# Patient Record
Sex: Female | Born: 1960 | Race: White | Hispanic: No | Marital: Married | Smoking: Former smoker
Health system: Southern US, Community
[De-identification: ages and names within clinical notes are randomized; demographics above are authoritative.]

## PROBLEM LIST (undated history)

## (undated) DIAGNOSIS — Z780 Asymptomatic menopausal state: Secondary | ICD-10-CM

## (undated) DIAGNOSIS — D649 Anemia, unspecified: Secondary | ICD-10-CM

## (undated) DIAGNOSIS — K219 Gastro-esophageal reflux disease without esophagitis: Secondary | ICD-10-CM

## (undated) DIAGNOSIS — I10 Essential (primary) hypertension: Secondary | ICD-10-CM

## (undated) DIAGNOSIS — G4730 Sleep apnea, unspecified: Secondary | ICD-10-CM

## (undated) DIAGNOSIS — D0500 Lobular carcinoma in situ of unspecified breast: Secondary | ICD-10-CM

## (undated) DIAGNOSIS — C801 Malignant (primary) neoplasm, unspecified: Secondary | ICD-10-CM

## (undated) DIAGNOSIS — F419 Anxiety disorder, unspecified: Secondary | ICD-10-CM

## (undated) HISTORY — DX: Asymptomatic menopausal state: Z78.0

## (undated) HISTORY — DX: Essential (primary) hypertension: I10

## (undated) HISTORY — DX: Anxiety disorder, unspecified: F41.9

## (undated) HISTORY — DX: Sleep apnea, unspecified: G47.30

## (undated) HISTORY — DX: Gastro-esophageal reflux disease without esophagitis: K21.9

## (undated) HISTORY — DX: Lobular carcinoma in situ of unspecified breast: D05.00

---

## 2005-12-06 HISTORY — PX: NOVASURE ABLATION: SHX5394

## 2010-06-10 ENCOUNTER — Ambulatory Visit: Payer: Self-pay | Admitting: Obstetrics and Gynecology

## 2011-01-19 DIAGNOSIS — D0500 Lobular carcinoma in situ of unspecified breast: Secondary | ICD-10-CM

## 2011-01-19 HISTORY — DX: Lobular carcinoma in situ of unspecified breast: D05.00

## 2011-12-07 DIAGNOSIS — Z780 Asymptomatic menopausal state: Secondary | ICD-10-CM

## 2011-12-07 HISTORY — DX: Asymptomatic menopausal state: Z78.0

## 2012-07-04 ENCOUNTER — Ambulatory Visit: Payer: Self-pay | Admitting: Obstetrics and Gynecology

## 2012-09-07 ENCOUNTER — Ambulatory Visit: Payer: Self-pay | Admitting: Anesthesiology

## 2012-09-07 LAB — POTASSIUM: Potassium: 3.9 mmol/L (ref 3.5–5.1)

## 2012-09-08 ENCOUNTER — Ambulatory Visit: Payer: Self-pay | Admitting: Obstetrics and Gynecology

## 2012-12-06 DIAGNOSIS — C801 Malignant (primary) neoplasm, unspecified: Secondary | ICD-10-CM

## 2012-12-06 HISTORY — PX: BREAST LUMPECTOMY: SHX2

## 2012-12-06 HISTORY — DX: Malignant (primary) neoplasm, unspecified: C80.1

## 2013-08-09 ENCOUNTER — Ambulatory Visit: Payer: Self-pay | Admitting: Obstetrics and Gynecology

## 2013-08-15 ENCOUNTER — Ambulatory Visit: Payer: Self-pay | Admitting: Obstetrics and Gynecology

## 2013-08-27 ENCOUNTER — Ambulatory Visit: Payer: Self-pay | Admitting: Obstetrics and Gynecology

## 2013-10-24 ENCOUNTER — Ambulatory Visit: Payer: Self-pay | Admitting: Anesthesiology

## 2013-10-24 LAB — POTASSIUM: Potassium: 3.4 mmol/L — ABNORMAL LOW (ref 3.5–5.1)

## 2013-10-26 ENCOUNTER — Ambulatory Visit: Payer: Self-pay | Admitting: Surgery

## 2013-10-26 HISTORY — PX: BREAST EXCISIONAL BIOPSY: SUR124

## 2013-10-26 HISTORY — PX: BREAST SURGERY: SHX581

## 2013-10-30 LAB — PATHOLOGY REPORT

## 2014-08-23 ENCOUNTER — Ambulatory Visit: Payer: Self-pay | Admitting: Surgery

## 2015-03-28 NOTE — Op Note (Signed)
PATIENT NAME:  Lori May, Lori May MR#:  [id number] DATE OF BIRTH:  09-01-1961  DATE OF PROCEDURE:  10/26/2013  PREOPERATIVE DIAGNOSIS: Right breast lobular carcinoma in situ.   POSTOPERATIVE DIAGNOSIS: Right breast lobular carcinoma in situ.   OPERATION: Right breast partial mastectomy.   SURGEON: Rodena Goldmann.   ANESTHESIA: General.   OPERATIVE PROCEDURE: With the patient in the supine position after induction of appropriate general anesthesia, the patient's right breast was prepped with ChloraPrep and draped with sterile towels. The lesion had been needle localized. An elliptical incision was made around partial portion of the areolar complex, and then down through the subcutaneous tissue with Bovie electrocautery. The area identified by the wire was encompassed by the dissection, grasped with Allis clamp and carefully dissected free. Both wires were identified and removed in the specimen. The specimen was sent for specimen mammography, which did reveal the presence of the microcalcifications and the previous stereotactic marker. The area was copiously irrigated. The subcutaneous space was obliterated with 3-0 Vicryl. The skin closed with a running subcuticular suture of 4-0 Vicryl. Benzoin and Steri-Strips were applied. The patient was returned to the recovery room having tolerated the procedure well. Sponge, instrument, and needle counts were correct x 2 in the operating room.   ____________________________ Micheline Maze, MD rle:aw D: 10/26/2013 11:48:34 ET T: 10/26/2013 11:59:21 ET JOB#: [id number]  cc: Rodena Goldmann III, MD, <Dictator> Shelby Mattocks. Georga Bora, MD Rodena Goldmann MD ELECTRONICALLY SIGNED 10/26/2013 17:50

## 2015-04-22 ENCOUNTER — Other Ambulatory Visit: Payer: Self-pay | Admitting: Obstetrics and Gynecology

## 2015-04-22 DIAGNOSIS — Z1231 Encounter for screening mammogram for malignant neoplasm of breast: Secondary | ICD-10-CM

## 2015-08-25 ENCOUNTER — Other Ambulatory Visit: Payer: Self-pay | Admitting: Obstetrics and Gynecology

## 2015-08-25 DIAGNOSIS — D0590 Unspecified type of carcinoma in situ of unspecified breast: Secondary | ICD-10-CM

## 2015-09-09 ENCOUNTER — Other Ambulatory Visit: Payer: Self-pay | Admitting: Obstetrics and Gynecology

## 2015-09-09 DIAGNOSIS — D0590 Unspecified type of carcinoma in situ of unspecified breast: Secondary | ICD-10-CM

## 2015-09-25 ENCOUNTER — Other Ambulatory Visit: Payer: Self-pay | Admitting: Obstetrics and Gynecology

## 2015-09-25 ENCOUNTER — Ambulatory Visit
Admission: RE | Admit: 2015-09-25 | Discharge: 2015-09-25 | Disposition: A | Discharge status: Home / Self Care | Payer: 59 | Source: Ambulatory Visit | Attending: Obstetrics and Gynecology | Admitting: Obstetrics and Gynecology

## 2015-09-25 DIAGNOSIS — R921 Mammographic calcification found on diagnostic imaging of breast: Secondary | ICD-10-CM | POA: Insufficient documentation

## 2015-09-25 DIAGNOSIS — N6002 Solitary cyst of left breast: Secondary | ICD-10-CM | POA: Diagnosis not present

## 2015-09-25 DIAGNOSIS — D0590 Unspecified type of carcinoma in situ of unspecified breast: Secondary | ICD-10-CM | POA: Diagnosis present

## 2015-09-30 ENCOUNTER — Other Ambulatory Visit: Payer: Self-pay | Admitting: Obstetrics and Gynecology

## 2015-09-30 DIAGNOSIS — R921 Mammographic calcification found on diagnostic imaging of breast: Secondary | ICD-10-CM

## 2015-10-06 ENCOUNTER — Ambulatory Visit
Admission: RE | Admit: 2015-10-06 | Discharge: 2015-10-06 | Disposition: A | Discharge status: Home / Self Care | Payer: 59 | Source: Ambulatory Visit | Attending: Obstetrics and Gynecology | Admitting: Obstetrics and Gynecology

## 2015-10-06 DIAGNOSIS — R921 Mammographic calcification found on diagnostic imaging of breast: Secondary | ICD-10-CM | POA: Diagnosis not present

## 2015-10-06 HISTORY — PX: BREAST BIOPSY: SHX20

## 2015-10-08 ENCOUNTER — Other Ambulatory Visit: Payer: Self-pay | Admitting: Obstetrics and Gynecology

## 2015-10-08 LAB — SURGICAL PATHOLOGY

## 2015-11-07 ENCOUNTER — Ambulatory Visit (INDEPENDENT_AMBULATORY_CARE_PROVIDER_SITE_OTHER): Payer: 59 | Admitting: Surgery

## 2015-11-07 ENCOUNTER — Encounter: Payer: Self-pay | Admitting: Surgery

## 2015-11-07 VITALS — BP 111/76 | HR 73 | Temp 97.8°F | Ht 67.0 in | Wt 139.0 lb

## 2015-11-07 DIAGNOSIS — N6092 Unspecified benign mammary dysplasia of left breast: Secondary | ICD-10-CM | POA: Insufficient documentation

## 2015-11-07 DIAGNOSIS — N62 Hypertrophy of breast: Secondary | ICD-10-CM | POA: Diagnosis not present

## 2015-11-07 NOTE — Patient Instructions (Signed)
Angie will be contacting you with your pre-operation appointment. If you have any questions or concerns, please give [place] a call.

## 2015-11-07 NOTE — Progress Notes (Signed)
Subjective:     Patient ID: Lori May, female   DOB: 1961/04/19, 54 y.o.   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  Patient underwent routine mamogram and found area on left breast with ADH as well.  She had 2 children starting at age 53, did not breast feed, was on birth control for 15 years and has not used any HRT.  She went through menopause about 3 years ago.  She has no family history of breast or other types of cancer.  She has not noticed any fever, chills, malaise, night sweats, weight loss, skin changes or nipple discharge.  Past Medical History  Diagnosis Date  . Breast cancer ([place]) 08/2013    right breast LCIS. No rad or chemo tx  . Hypertension   . Postmenopausal 2013   Past Surgical History  Procedure Laterality Date  . Breast surgery Right 10/26/2013    Partial Mastectomy   Family History  Problem Relation Age of Onset  . Leukemia Mother 47  . Heart disease Father   . Hypertension Sister   . Hypertension Brother    Social History   Social History  . Marital Status: Married    Spouse Name: N/A  . Number of Children: N/A  . Years of Education: N/A   Social History Main Topics  . Smoking status: Current Every Day Smoker -- 0.50 packs/day    Types: Cigarettes  . Smokeless tobacco: None     Comment: ocassional smoker  . Alcohol Use: 0.0 oz/week    0 Standard drinks or equivalent per week     Comment: wine   . Drug Use: No  . Sexual Activity: Not Asked   Other Topics Concern  . None   Social History Narrative    Current outpatient prescriptions:  .  acetaminophen (TYLENOL) 500 MG tablet, Take 500 mg by mouth as needed., Disp: , Rfl:  .  hydrochlorothiazide (HYDRODIURIL) 25 MG tablet, Take 1 tablet by mouth 1 day or 1 dose., Disp: , Rfl:  .  lisinopril (PRINIVIL,ZESTRIL) 10 MG tablet, Take 10 mg by mouth every morning., Disp: , Rfl: 0 .  traZODone  (DESYREL) 100 MG tablet, Take 1 tablet by mouth 1 day or 1 dose., Disp: , Rfl: 0 .  zolpidem (AMBIEN) 10 MG tablet, Take 1 tablet by mouth 1 day or 1 dose., Disp: , Rfl: 0 No Known Allergies    Review of Systems  Constitutional: Negative for fever, chills, activity change, appetite change, fatigue and unexpected weight change.  HENT: Negative for congestion and sore throat.   Respiratory: Negative for cough, shortness of breath and wheezing.   Cardiovascular: Negative for chest pain, palpitations and leg swelling.  Gastrointestinal: Negative for nausea, vomiting, abdominal pain and abdominal distention.  Genitourinary: Negative for dysuria and hematuria.  Musculoskeletal: Negative for back pain and joint swelling.  Skin: Negative for color change, pallor, rash and wound.  Neurological: Negative for dizziness and weakness.  Hematological: Negative for adenopathy. Does not bruise/bleed easily.  Psychiatric/Behavioral: Negative for agitation. The patient is not nervous/anxious.   All other systems reviewed and are negative.  Filed Vitals:   11/07/15 1031  BP: 111/76  Pulse: 73  Temp: 97.8 F (36.6 C)       Objective:   Physical Exam  Constitutional: She is oriented to person, place, and time. She appears well-developed and well-nourished. No  distress.  HENT:  Head: Atraumatic.  Right Ear: External ear normal.  Left Ear: External ear normal.  Nose: Nose normal.  Mouth/Throat: Oropharynx is clear and moist. No oropharyngeal exudate.  Eyes: Conjunctivae and EOM are normal. Pupils are equal, round, and reactive to light. No scleral icterus.  Neck: Normal range of motion. No tracheal deviation present.  Cardiovascular: Regular rhythm, normal heart sounds and intact distal pulses.  Exam reveals no gallop and no friction rub.   No murmur heard. Pulmonary/Chest: Effort normal and breath sounds normal. No respiratory distress. She has no wheezes. She has no rales. She exhibits no  tenderness.  Abdominal: Soft. Bowel sounds are normal. She exhibits no distension. There is no tenderness. There is no rebound and no guarding.  Musculoskeletal: Normal range of motion. She exhibits no edema or tenderness.  Neurological: She is alert and oriented to person, place, and time. No cranial nerve deficit.  Skin: Skin is warm and dry. No rash noted. No erythema. No pallor.  Psychiatric: She has a normal mood and affect. Her behavior is normal. Judgment and thought content normal.  Vitals reviewed. Right breast: well healed incision site around N/A complex, no other skin changes, no masses, no nipple retraction, no supraclavicular or axillary lymphadenopathy  Left breast:  No skin changes, no masses palpable, no nipple retraction, no supraclavicular or axillary lymphadenopathy.   IMAGING  IMPRESSION: 1. New loosely grouped pleomorphic calcifications within the inner LEFT breast, at middle to posterior depth, for which stereotactic-guided biopsy is recommended. 2. Probably benign complicated cysts within the left breast at the 2 o'clock axis, 3-5 cm from the nipple, largest measuring 9 mm greatest dimension. Regardless of the results of the upcoming stereotactic biopsy, a six-month follow-up left breast diagnostic mammogram and ultrasound will be needed to ensure stability.  Labs:  ATYPICAL DUCTAL HYPERPLASIA WITH MICROCALCIFICATIONS.  - FIBROCYSTIC CHANGE WITH MICROCALCIFICATIONS.     Assessment:     54 yr old with ADH in left breast with hx of LCIS prior    Plan:     I discussed the available options with the patient and husband. With a history of LCIS her risk for breast cancer or DCIS in either breast is increased from average.  Now with ADH in the opposite breast her relative risk is now 4, with about a 25% lifetime risk of cancer.  I discussed with patient that there is a 30% chance of a sampling error and actually having DCIS or invasive cancer that wasn't picked up  on the biopsy specimen.  She was interested in avoiding any operations if at all possible and while there are some studies with low risk patient with ADH alone the current recommendation is excisional biopsy.   I also discussed that given the small size of the area would recommend a needle localization lumpectomy with radiation to follow. I also discussed that we would not need to get lymph nodes with this but if the pathology came back as cancer on the final may need second procedure to sample these.  I also discussed with patient that given LCIS history and now ADH in contralateral breast would put her risk above the 1% margin and would recommend her getting Tamoxifen for 5 years postoperatively to help reduce risk of a breast cancer in the future.  Patient is in agreement with the pain.    I discussed risks of bleeding, infection, damage to surrounding tissues, having positive margins, needing further resection; as well as anesthesia risks of  MI, stroke, prolonged ventilation, pulmonary embolism, thrombosis and even death. Patient was given the opportunity to ask questions and have them answered. They would like to proceed with Left breast needle localized lumpectomy.

## 2015-12-11 NOTE — Addendum Note (Signed)
Encounter addended by: Wayne Both on: 12/11/2015  1:42 PM<BR>     Documentation filed: Charges VN

## 2015-12-15 ENCOUNTER — Telehealth: Payer: Self-pay | Admitting: Surgery

## 2015-12-15 NOTE — Telephone Encounter (Signed)
I have called patient to advise patient of pre op date/time and sx date, no answer. I have left a message on voicemail for patient to call back for information below.  Sx: 01/07/16 with Dr Carole Binning needle localization lumpectomy.  Pre op: 12/29/15 between 9-1:00pm--Phone.  Patient to arrive at 8:15am at [facility] the day of surgery/ Patient does not need to call the day before to find out arrival time.

## 2015-12-17 NOTE — Telephone Encounter (Signed)
Patient has called back and surgery information was given. Patient understands all directions.

## 2015-12-29 ENCOUNTER — Encounter: Payer: Self-pay | Admitting: *Deleted

## 2015-12-29 ENCOUNTER — Other Ambulatory Visit: Payer: 59

## 2015-12-29 NOTE — Patient Instructions (Signed)
  Your procedure is scheduled on: 01-07-16 [facility]) Report to [place] (8:15)   Remember: Instructions that are not followed completely may result in serious medical risk, up to and including death, or upon the discretion of your surgeon and anesthesiologist your surgery may need to be rescheduled.    _X___ 1. Do not eat food or drink liquids after midnight. No gum chewing or hard candies.     _X___ 2. No Alcohol for 24 hours before or after surgery.   ____ 3. Bring all medications with you on the day of surgery if instructed.    _X___ 4. Notify your doctor if there is any change in your medical condition     (cold, fever, infections).     Do not wear jewelry, make-up, hairpins, clips or nail polish.  Do not wear lotions, powders, or perfumes. You may wear deodorant.  Do not shave 48 hours prior to surgery. Men may shave face and neck.  Do not bring valuables to the hospital.    [facility] is not responsible for any belongings or valuables.               Contacts, dentures or bridgework may not be worn into surgery.  Leave your suitcase in the car. After surgery it may be brought to your room.  For patients admitted to the hospital, discharge time is determined by your  treatment team.   Patients discharged the day of surgery will not be allowed to drive home.   Please read over the following fact sheets that you were given:     __X__ Take these medicines the morning of surgery with A SIP OF WATER:    1. LISINOPRIL  2.   3.   4.  5.  6.  ____ Fleet Enema (as directed)   _X___ Use CHG Soap as directed  ____ Use inhalers on the day of surgery  ____ Stop metformin 2 days prior to surgery    ____ Take 1/2 of usual insulin dose the night before surgery and none on the morning of surgery.   ____ Stop Coumadin/Plavix/aspirin-N/A  ____ Stop Anti-inflammatories-NO NSAIDS OR ASPIRIN PRODUCTS-TYLENOL OK TO TAKE   ____ Stop supplements until after surgery.     ____ Bring C-Pap to the hospital.

## 2016-01-02 ENCOUNTER — Other Ambulatory Visit: Payer: Self-pay

## 2016-01-02 ENCOUNTER — Encounter
Admission: RE | Admit: 2016-01-02 | Discharge: 2016-01-02 | Disposition: A | Discharge status: Home / Self Care | Payer: 59 | Source: Ambulatory Visit | Attending: Surgery | Admitting: Surgery

## 2016-01-02 DIAGNOSIS — Z01812 Encounter for preprocedural laboratory examination: Secondary | ICD-10-CM | POA: Insufficient documentation

## 2016-01-02 DIAGNOSIS — Z01810 Encounter for preprocedural cardiovascular examination: Secondary | ICD-10-CM | POA: Insufficient documentation

## 2016-01-02 LAB — COMPREHENSIVE METABOLIC PANEL
ALT: 14 U/L (ref 14–54)
ANION GAP: 10 (ref 5–15)
AST: 20 U/L (ref 15–41)
Albumin: 5 g/dL (ref 3.5–5.0)
Alkaline Phosphatase: 55 U/L (ref 38–126)
BUN: 8 mg/dL (ref 6–20)
CHLORIDE: 98 mmol/L — AB (ref 101–111)
CO2: 29 mmol/L (ref 22–32)
CREATININE: 0.68 mg/dL (ref 0.44–1.00)
Calcium: 10 mg/dL (ref 8.9–10.3)
Glucose, Bld: 99 mg/dL (ref 65–99)
POTASSIUM: 3.6 mmol/L (ref 3.5–5.1)
SODIUM: 137 mmol/L (ref 135–145)
Total Bilirubin: 0.5 mg/dL (ref 0.3–1.2)
Total Protein: 7.6 g/dL (ref 6.5–8.1)

## 2016-01-02 LAB — CBC WITH DIFFERENTIAL/PLATELET
Basophils Absolute: 0 10*3/uL (ref 0–0.1)
Basophils Relative: 0 %
EOS ABS: 0.1 10*3/uL (ref 0–0.7)
EOS PCT: 1 %
HCT: 42.9 % (ref 35.0–47.0)
Hemoglobin: 15.1 g/dL (ref 12.0–16.0)
LYMPHS ABS: 1.4 10*3/uL (ref 1.0–3.6)
Lymphocytes Relative: 24 %
MCH: 32 pg (ref 26.0–34.0)
MCHC: 35.3 g/dL (ref 32.0–36.0)
MCV: 90.8 fL (ref 80.0–100.0)
MONO ABS: 0.6 10*3/uL (ref 0.2–0.9)
Monocytes Relative: 10 %
NEUTROS PCT: 65 %
Neutro Abs: 3.9 10*3/uL (ref 1.4–6.5)
PLATELETS: 213 10*3/uL (ref 150–440)
RBC: 4.72 MIL/uL (ref 3.80–5.20)
RDW: 12.3 % (ref 11.5–14.5)
WBC: 6 10*3/uL (ref 3.6–11.0)

## 2016-01-07 ENCOUNTER — Ambulatory Visit: Payer: 59 | Admitting: Anesthesiology

## 2016-01-07 ENCOUNTER — Ambulatory Visit
Admission: RE | Admit: 2016-01-07 | Discharge: 2016-01-07 | Disposition: A | Discharge status: Home / Self Care | Payer: 59 | Source: Ambulatory Visit | Attending: Surgery | Admitting: Surgery

## 2016-01-07 ENCOUNTER — Encounter: Payer: Self-pay | Admitting: *Deleted

## 2016-01-07 ENCOUNTER — Encounter
Admission: RE | Disposition: A | Discharge status: Home / Self Care | Payer: Self-pay | Source: Ambulatory Visit | Attending: Surgery

## 2016-01-07 ENCOUNTER — Other Ambulatory Visit: Payer: Self-pay | Admitting: Surgery

## 2016-01-07 DIAGNOSIS — Z806 Family history of leukemia: Secondary | ICD-10-CM | POA: Insufficient documentation

## 2016-01-07 DIAGNOSIS — N6092 Unspecified benign mammary dysplasia of left breast: Secondary | ICD-10-CM

## 2016-01-07 DIAGNOSIS — F17210 Nicotine dependence, cigarettes, uncomplicated: Secondary | ICD-10-CM | POA: Diagnosis not present

## 2016-01-07 DIAGNOSIS — Z79899 Other long term (current) drug therapy: Secondary | ICD-10-CM | POA: Diagnosis not present

## 2016-01-07 DIAGNOSIS — Z8249 Family history of ischemic heart disease and other diseases of the circulatory system: Secondary | ICD-10-CM | POA: Diagnosis not present

## 2016-01-07 DIAGNOSIS — Z853 Personal history of malignant neoplasm of breast: Secondary | ICD-10-CM | POA: Diagnosis not present

## 2016-01-07 DIAGNOSIS — N62 Hypertrophy of breast: Secondary | ICD-10-CM | POA: Diagnosis not present

## 2016-01-07 DIAGNOSIS — N63 Unspecified lump in breast: Secondary | ICD-10-CM | POA: Diagnosis present

## 2016-01-07 DIAGNOSIS — I10 Essential (primary) hypertension: Secondary | ICD-10-CM | POA: Insufficient documentation

## 2016-01-07 DIAGNOSIS — D649 Anemia, unspecified: Secondary | ICD-10-CM | POA: Diagnosis not present

## 2016-01-07 HISTORY — DX: Anemia, unspecified: D64.9

## 2016-01-07 HISTORY — PX: BREAST LUMPECTOMY WITH NEEDLE LOCALIZATION: SHX5759

## 2016-01-07 HISTORY — PX: BREAST EXCISIONAL BIOPSY: SUR124

## 2016-01-07 SURGERY — BREAST LUMPECTOMY WITH NEEDLE LOCALIZATION
Anesthesia: General | Laterality: Left

## 2016-01-07 MED ORDER — CEFAZOLIN SODIUM-DEXTROSE 2-3 GM-% IV SOLR
2.0000 g | INTRAVENOUS | Status: AC
  Administered 2016-01-07: 2 g via INTRAVENOUS

## 2016-01-07 MED ORDER — SODIUM CHLORIDE 0.9 % IJ SOLN
INTRAMUSCULAR | Status: AC
  Filled 2016-01-07: qty 10

## 2016-01-07 MED ORDER — PROPOFOL 10 MG/ML IV BOLUS
INTRAVENOUS | Status: DC | PRN
  Administered 2016-01-07: 150 mg via INTRAVENOUS

## 2016-01-07 MED ORDER — METHYLENE BLUE 1 % INJ SOLN
INTRAMUSCULAR | Status: AC
  Filled 2016-01-07: qty 10

## 2016-01-07 MED ORDER — BUPIVACAINE-EPINEPHRINE (PF) 0.5% -1:200000 IJ SOLN
INTRAMUSCULAR | Status: DC | PRN
  Administered 2016-01-07: 6 mL via PERINEURAL

## 2016-01-07 MED ORDER — EPHEDRINE SULFATE 50 MG/ML IJ SOLN
INTRAMUSCULAR | Status: DC | PRN
  Administered 2016-01-07: 10 mg via INTRAVENOUS

## 2016-01-07 MED ORDER — FENTANYL CITRATE (PF) 100 MCG/2ML IJ SOLN
25.0000 ug | INTRAMUSCULAR | Status: DC | PRN
  Administered 2016-01-07 (×4): 25 ug via INTRAVENOUS

## 2016-01-07 MED ORDER — LIDOCAINE HCL (CARDIAC) 20 MG/ML IV SOLN
INTRAVENOUS | Status: DC | PRN
  Administered 2016-01-07: 40 mg via INTRAVENOUS

## 2016-01-07 MED ORDER — BUPIVACAINE-EPINEPHRINE (PF) 0.5% -1:200000 IJ SOLN
INTRAMUSCULAR | Status: AC
  Filled 2016-01-07: qty 30

## 2016-01-07 MED ORDER — FENTANYL CITRATE (PF) 100 MCG/2ML IJ SOLN
INTRAMUSCULAR | Status: DC | PRN
  Administered 2016-01-07 (×2): 50 ug via INTRAVENOUS

## 2016-01-07 MED ORDER — FENTANYL CITRATE (PF) 100 MCG/2ML IJ SOLN
INTRAMUSCULAR | Status: AC
  Administered 2016-01-07: 25 ug via INTRAVENOUS
  Filled 2016-01-07: qty 2

## 2016-01-07 MED ORDER — CEFAZOLIN SODIUM-DEXTROSE 2-3 GM-% IV SOLR
INTRAVENOUS | Status: AC
  Filled 2016-01-07: qty 50

## 2016-01-07 MED ORDER — FAMOTIDINE 20 MG PO TABS
ORAL_TABLET | ORAL | Status: AC
  Administered 2016-01-07: 20 mg via ORAL
  Filled 2016-01-07: qty 1

## 2016-01-07 MED ORDER — FAMOTIDINE 20 MG PO TABS
20.0000 mg | ORAL_TABLET | Freq: Once | ORAL | Status: AC
  Administered 2016-01-07: 20 mg via ORAL

## 2016-01-07 MED ORDER — GLYCOPYRROLATE 0.2 MG/ML IJ SOLN
INTRAMUSCULAR | Status: DC | PRN
  Administered 2016-01-07: 0.2 mg via INTRAVENOUS

## 2016-01-07 MED ORDER — ONDANSETRON HCL 4 MG/2ML IJ SOLN
INTRAMUSCULAR | Status: DC | PRN
  Administered 2016-01-07: 4 mg via INTRAVENOUS

## 2016-01-07 MED ORDER — CHLORHEXIDINE GLUCONATE 4 % EX LIQD: 1.0000 "application " | Freq: Once | CUTANEOUS | Status: DC

## 2016-01-07 MED ORDER — DEXAMETHASONE SODIUM PHOSPHATE 10 MG/ML IJ SOLN
INTRAMUSCULAR | Status: DC | PRN
  Administered 2016-01-07: 8 mg via INTRAVENOUS

## 2016-01-07 MED ORDER — LACTATED RINGERS IV SOLN
INTRAVENOUS | Status: DC
  Administered 2016-01-07: 10:00:00 via INTRAVENOUS

## 2016-01-07 MED ORDER — ONDANSETRON HCL 4 MG/2ML IJ SOLN: 4.0000 mg | Freq: Once | INTRAMUSCULAR | Status: DC | PRN

## 2016-01-07 MED ORDER — MIDAZOLAM HCL 2 MG/2ML IJ SOLN
INTRAMUSCULAR | Status: DC | PRN
  Administered 2016-01-07: 2 mg via INTRAVENOUS

## 2016-01-07 MED ORDER — OXYCODONE-ACETAMINOPHEN 5-325 MG PO TABS: 1.0000 | ORAL_TABLET | ORAL | Status: DC | PRN

## 2016-01-07 SURGICAL SUPPLY — 39 items
BLADE SURG 15 STRL LF DISP TIS (BLADE) ×1 IMPLANT
BLADE SURG 15 STRL SS (BLADE) ×2
CANISTER SUCT 1200ML W/VALVE (MISCELLANEOUS) ×3 IMPLANT
CHLORAPREP W/TINT 26ML (MISCELLANEOUS) ×3 IMPLANT
CNTNR SPEC 2.5X3XGRAD LEK (MISCELLANEOUS) ×1
CONT SPEC 4OZ STER OR WHT (MISCELLANEOUS) ×2
CONTAINER SPEC 2.5X3XGRAD LEK (MISCELLANEOUS) ×1 IMPLANT
DEVICE DUBIN SPECIMEN MAMMOGRA (MISCELLANEOUS) ×9 IMPLANT
DRAPE LAPAROTOMY TRNSV 106X77 (MISCELLANEOUS) ×3 IMPLANT
DRAPE SHEET LG 3/4 BI-LAMINATE (DRAPES) ×3 IMPLANT
ELECT CAUTERY BLADE 6.4 (BLADE) ×3 IMPLANT
ELECT REM PT RETURN 9FT ADLT (ELECTROSURGICAL) ×3
ELECTRODE REM PT RTRN 9FT ADLT (ELECTROSURGICAL) ×1 IMPLANT
GAUZE FLUFF 18X24 1PLY STRL (GAUZE/BANDAGES/DRESSINGS) ×3 IMPLANT
GLOVE BIO SURGEON STRL SZ 6.5 (GLOVE) ×4 IMPLANT
GLOVE BIO SURGEONS STRL SZ 6.5 (GLOVE) ×2
GLOVE BIOGEL M 7.0 STRL (GLOVE) ×3 IMPLANT
GLOVE BIOGEL PI IND STRL 7.0 (GLOVE) ×1 IMPLANT
GLOVE BIOGEL PI INDICATOR 7.0 (GLOVE) ×2
GLOVE PI ORTHOPRO 6.5 (GLOVE) ×2
GLOVE PI ORTHOPRO STRL 6.5 (GLOVE) ×1 IMPLANT
GOWN STRL REUS W/ TWL LRG LVL3 (GOWN DISPOSABLE) ×3 IMPLANT
GOWN STRL REUS W/TWL LRG LVL3 (GOWN DISPOSABLE) ×6
LIQUID BAND (GAUZE/BANDAGES/DRESSINGS) ×6 IMPLANT
MARGIN MAP 10MM (MISCELLANEOUS) IMPLANT
NDL SAFETY ECLIPSE 18X1.5 (NEEDLE) IMPLANT
NEEDLE HYPO 18GX1.5 SHARP (NEEDLE)
NEEDLE HYPO 25X1 1.5 SAFETY (NEEDLE) ×3 IMPLANT
PACK BASIN MINOR ARMC (MISCELLANEOUS) ×3 IMPLANT
SLEVE PROBE SENORX GAMMA FIND (MISCELLANEOUS) IMPLANT
STOCKINETTE IMPERVIOUS 9X36 MD (GAUZE/BANDAGES/DRESSINGS) IMPLANT
SURGI-BRA LG (MISCELLANEOUS) ×3 IMPLANT
SUT MNCRL 3-0 UNDYED SH (SUTURE) ×1 IMPLANT
SUT MNCRL 4-0 (SUTURE) ×2
SUT MNCRL 4-0 27XMFL (SUTURE) ×1
SUT MONOCRYL 3-0 UNDYED (SUTURE) ×2
SUT SILK 2 0 SH (SUTURE) ×3 IMPLANT
SUTURE MNCRL 4-0 27XMF (SUTURE) ×1 IMPLANT
SYRINGE 10CC LL (SYRINGE) ×3 IMPLANT

## 2016-01-07 NOTE — Discharge Instructions (Signed)

## 2016-01-07 NOTE — Anesthesia Procedure Notes (Signed)
Procedure Name: LMA Insertion Date/Time: 01/07/2016 10:25 AM Performed by: Allean Found Pre-anesthesia Checklist: Patient identified, Emergency Drugs available, Suction available, Patient being monitored and Timeout performed Patient Re-evaluated:Patient Re-evaluated prior to inductionOxygen Delivery Method: Circle system utilized Preoxygenation: Pre-oxygenation with 100% oxygen Intubation Type: IV induction Ventilation: Mask ventilation without difficulty LMA: LMA inserted LMA Size: 4.0 Number of attempts: 1 Placement Confirmation: positive ETCO2 Tube secured with: Tape Dental Injury: Teeth and Oropharynx as per pre-operative assessment

## 2016-01-07 NOTE — Anesthesia Preprocedure Evaluation (Addendum)
Anesthesia Evaluation  Patient identified by MRN, date of birth, ID band Patient awake    Reviewed: Allergy & Precautions, NPO status , Patient's Chart, lab work & pertinent test results, reviewed documented beta blocker date and time   Airway Mallampati: II  TM Distance: >3 FB     Dental  (+) Lower Dentures, Upper Dentures   Pulmonary Current Smoker,           Cardiovascular hypertension,      Neuro/Psych    GI/Hepatic   Endo/Other    Renal/GU      Musculoskeletal   Abdominal   Peds  Hematology  (+) anemia ,   Anesthesia Other Findings   Reproductive/Obstetrics                            Anesthesia Physical Anesthesia Plan  ASA: II  Anesthesia Plan: General   Post-op Pain Management:    Induction: Intravenous  Airway Management Planned: LMA  Additional Equipment:   Intra-op Plan:   Post-operative Plan:   Informed Consent: I have reviewed the patients History and Physical, chart, labs and discussed the procedure including the risks, benefits and alternatives for the proposed anesthesia with the patient or authorized representative who has indicated his/her understanding and acceptance.     Plan Discussed with: CRNA  Anesthesia Plan Comments:         Anesthesia Quick Evaluation

## 2016-01-07 NOTE — H&P (Addendum)
Lori May is an 55 y.o. female.   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 Patient underwent routine mamogram and found area on left breast with ADH as well. She had 2 children starting at age 26, did not breast feed, was on birth control for 15 years and has not used any HRT. She went through menopause about 3 years ago. She has no family history of breast or other types of cancer. She has not noticed any fever, chills, malaise, night sweats, weight loss, skin changes or nipple discharge.  Past Medical History  Diagnosis Date  . Hypertension   . Postmenopausal 2013  . Anemia     H/O  . Breast cancer ([place]) 08/2013    right breast LCIS. No rad or chemo tx    Past Surgical History  Procedure Laterality Date  . Breast surgery Right 10/26/2013    Partial Mastectomy  . Novasure ablation  2007  . Breast biopsy Left 10/06/2015    ADH    Family History  Problem Relation Age of Onset  . Leukemia Mother 53  . Heart disease Father   . Hypertension Sister   . Hypertension Brother    Social History:  reports that she has been smoking Cigarettes.  She has been smoking about 0.50 packs per day for the past 0 years. She does not have any smokeless tobacco history on file. She reports that she drinks alcohol. She reports that she does not use illicit drugs.  Allergies: No Known Allergies  Medications Prior to Admission  Medication Sig Dispense Refill  . acetaminophen (TYLENOL) 500 MG tablet Take 500 mg by mouth as needed.    . hydrochlorothiazide (HYDRODIURIL) 25 MG tablet Take 1 tablet by mouth daily.     Marland Kitchen lisinopril (PRINIVIL,ZESTRIL) 10 MG tablet Take 10 mg by mouth every morning.  0  . traZODone (DESYREL) 100 MG tablet Take 1 tablet by mouth at bedtime.   0  . zolpidem (AMBIEN) 10 MG tablet Take 1 tablet by mouth at bedtime.   0    No results found  for this or any previous visit (from the past 48 hour(s)). No results found.  Review of Systems  Constitutional: Negative for fever and chills.  HENT: Negative for congestion and sore throat.   Respiratory: Negative for cough, shortness of breath and wheezing.   Cardiovascular: Negative for chest pain and leg swelling.  Gastrointestinal: Negative for nausea and abdominal pain.  Genitourinary: Negative for dysuria and hematuria.  Musculoskeletal: Negative for back pain.  Neurological: Negative for dizziness, loss of consciousness and weakness.  Psychiatric/Behavioral: The patient is not nervous/anxious.     Blood pressure 114/83, pulse 72, temperature 98.1 F (36.7 C), temperature source Oral, resp. rate 18, height 5\' 7"  (1.702 m), weight 135 lb (61.236 kg), SpO2 100 %. Physical Exam  Vitals reviewed. Constitutional: She is oriented to person, place, and time. She appears well-developed and well-nourished. No distress.  Cardiovascular: Normal rate and regular rhythm.   Respiratory: Effort normal.  GI: Soft. Bowel sounds are normal.  Musculoskeletal: Normal range of motion. She exhibits no edema.  Neurological: She is alert and oriented to person, place, and time.  Skin: Skin is warm and dry.  Localized needle in right breast   Psychiatric: She has a normal mood and affect. Her behavior is normal. Judgment and thought content normal.  Assessment/Plan 55 yr old with ADH I discussed the available options with the patient and husband. With a history of LCIS her risk for breast cancer or DCIS in either breast is increased from average. Now with ADH in the opposite breast her relative risk is now 4, with about a 25% lifetime risk of cancer. I discussed with patient that there is a 30% chance of a sampling error and actually having DCIS or invasive cancer that wasn't picked up on the biopsy specimen. She was interested in avoiding any operations if at all possible and while there are  some studies with low risk patient with ADH alone the current recommendation is excisional biopsy. I also discussed that given the small size of the area would recommend a needle localization lumpectomy. I also discussed that we would not need to get lymph nodes with this but if the pathology came back as cancer on the final may need second procedure to sample these. I also discussed with patient that given LCIS history and now ADH in contralateral breast would put her risk above the 1% margin and would recommend her getting Tamoxifen for 5 years postoperatively to help reduce risk of a breast cancer in the future. Patient is in agreement with the plan.  Hubbard Robinson, MD 01/07/2016, 10:00 AM

## 2016-01-07 NOTE — Transfer of Care (Signed)
Immediate Anesthesia Transfer of Care Note  Patient: Lori May  Procedure(s) Performed: Procedure(s): BREAST LUMPECTOMY WITH NEEDLE LOCALIZATION (Left)  Patient Location: PACU  Anesthesia Type:General  Level of Consciousness: awake  Airway & Oxygen Therapy: Patient Spontanous Breathing and Patient connected to face mask oxygen  Post-op Assessment: Report given to RN and Post -op Vital signs reviewed and stable  Post vital signs: Reviewed and stable  Last Vitals:  Filed Vitals:   01/07/16 0854 01/07/16 1239  BP: 114/83 110/85  Pulse: 72 86  Temp: 36.7 C 36.7 C  Resp: 18 17    Complications: No apparent anesthesia complications

## 2016-01-07 NOTE — Op Note (Signed)
Breast Lumpectomy with Sentinal Node Biopsy Procedure Note  Indications: This patient presents with history of a left breast mass. Given the clinical history and physical exam, along with indicated diagnostic studies, breast biopsy will be performed.  Pre-operative Diagnosis: left breast mass  Post-operative Diagnosis: left breast mass  Surgeon: Hubbard Robinson   Assistants: none  Anesthesia: General endotracheal anesthesia  ASA Class: 2  Procedure Details  The patient was seen in the Holding Room. The risks, benefits, complications, treatment options, and expected outcomes were discussed with the patient. The possibilities of reaction to medication, pulmonary aspiration, bleeding, infection, the need for additional procedures, failure to diagnose a condition, and creating a complication requiring transfusion or operation were discussed with the patient. The patient concurred with the proposed plan, giving informed consent. The site of surgery properly noted/marked. The patient was taken to Operating Room, identified as Lori May, and the procedure verified as lumpectomy. A Time Out was held and the above information confirmed.  After induction of anesthesia, the left breast and chest were prepped and draped in standard fashion. The lumpectomy was performed by creating an curvilinear incision over the medial nipple areolar complex of the breast. Orientation sutures were placed, long lateral and short superior and two long medial markers, and hemostasis was achieved with cautery.  Additional tissue was taken along the medial inferior and lateral inferior margin and submitted separately to pathology after providing orientation for the pathology. The wound was irrigated and closed with a 4-0 Vicryl subcuticular closure in layers.    The specimens were sent to radiology with the clip being seen in the lateral inferior margin and being sent to pathology who saw clip in lateral inferior margin  specimen without any evidence of disease.   Sterile dressings were applied. At the end of the operation, all sponge, instrument, and needle counts were correct.  Findings: grossly clear surgical margins  Estimated Blood Loss:  Minimal         Drains: none         Total IV Fluids: 1049mL         Specimens: breast mass             Complications:  None; patient tolerated the procedure well.         Disposition: PACU - hemodynamically stable.         Condition: Stable

## 2016-01-08 LAB — SURGICAL PATHOLOGY

## 2016-01-08 NOTE — Anesthesia Postprocedure Evaluation (Signed)
Anesthesia Post Note  Patient: Lori May  Procedure(s) Performed: Procedure(s) (LRB): BREAST LUMPECTOMY WITH NEEDLE LOCALIZATION (Left)  Patient location during evaluation: PACU Anesthesia Type: General Level of consciousness: awake and alert Pain management: pain level controlled Vital Signs Assessment: post-procedure vital signs reviewed and stable Respiratory status: spontaneous breathing, nonlabored ventilation, respiratory function stable and patient connected to nasal cannula oxygen Cardiovascular status: blood pressure returned to baseline and stable Postop Assessment: no signs of nausea or vomiting Anesthetic complications: no    Last Vitals:  Filed Vitals:   01/07/16 1342 01/07/16 1400  BP: 111/74 101/68  Pulse: 67 69  Temp: 36 C   Resp: 16     Last Pain:  Filed Vitals:   01/07/16 1406  PainSc: 3                  Rawson Minix S

## 2016-01-20 ENCOUNTER — Encounter: Payer: Self-pay | Admitting: Surgery

## 2016-01-20 ENCOUNTER — Ambulatory Visit (INDEPENDENT_AMBULATORY_CARE_PROVIDER_SITE_OTHER): Payer: 59 | Admitting: Surgery

## 2016-01-20 VITALS — BP 132/85 | HR 74 | Temp 97.9°F | Wt 134.0 lb

## 2016-01-20 DIAGNOSIS — D0501 Lobular carcinoma in situ of right breast: Secondary | ICD-10-CM

## 2016-01-20 DIAGNOSIS — N62 Hypertrophy of breast: Secondary | ICD-10-CM

## 2016-01-20 DIAGNOSIS — N6092 Unspecified benign mammary dysplasia of left breast: Secondary | ICD-10-CM

## 2016-01-20 MED ORDER — VARENICLINE TARTRATE 0.5 MG X 11 & 1 MG X 42 PO MISC: ORAL | Status: DC

## 2016-01-20 MED ORDER — TAMOXIFEN CITRATE 10 MG PO TABS: 10.0000 mg | ORAL_TABLET | Freq: Two times a day (BID) | ORAL | Status: DC

## 2016-01-20 NOTE — Patient Instructions (Signed)
We will refer you to Dr. Grayland Ormond (Oncologist). We will call you with an appointment to schedule a mammogram and to follow up with Dr. Azalee Course in six months.

## 2016-01-20 NOTE — Progress Notes (Signed)
55 year old female status post left lumpectomy for Atypical ductal hyperplasia.  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  Patient states doing well.  Some tenderness over area, states was bruised but seems to be healing now.  Patient is still smoking and would like something to help her quit.   Filed Vitals:   01/20/16 0759  BP: 132/85  Pulse: 74  Temp: 97.9 F (36.6 C)   PE:  Gen: NAD Left Breast: incision clean, dry an intact, some mild erythema extending to the medial side of wound, along dissection path, no fluctuance or drainage, small area of healing superficial necrolysis.    Pathology: ADH in left breast, removed with clear margin, clip removed.    A/P:  Patient healing well status post left lumpectomy. I discussed her pathology results with her. Also discussed that considering her previous LCIS and now ADH and contralateral breast that she is a much higher risk for breast cancer in the future given that she is only 54 blood recommend that she start on an aromatase inhibitor such as tamoxifen for this. I have previously discussed this with Dr. Grayland Ormond the oncologist, who I will refer her to to discuss her risk as well. I did reiterate to the patient that she does not have cancer but does have a increased risk of having a cancer in the future. I also gave her a prescription for Tamoxifen, and discussed the risk of a slight increase of endometrial cancer as well as blood clots. Will have her f/u in 74months with mammogram.   I also discussed with her that given the mild erythema I do not feel she has an infection at this time but may have had some complementation of blood flow to the area which is increased due to her smoking. Patient discussed with me that she would like to quit smoking and has been trying for years and quit in the past without help but then restarted after 3 years. Patient discussed with me that she would like to try a prescription at this time.  I did discuss with her that from a overall health standpoint as well as a wound healing standpoint it would be best for her to quit smoking I will give her a prescription for Chantix and discussed with her the risk of having nightmares with this. Also discussed that she should assign a date 7-10 days after taking the Chantix for her to put smoking altogether. Also discussed with her that she could use nicotine, to chew and place in the side of the mouth for acute cravings.  I spent 15 minutes with the discussed of smoking cessation alone.

## 2016-01-27 ENCOUNTER — Ambulatory Visit: Payer: 59 | Admitting: Oncology

## 2016-01-29 ENCOUNTER — Ambulatory Visit: Payer: 59 | Admitting: Surgery

## 2016-02-05 ENCOUNTER — Telehealth: Payer: Self-pay | Admitting: Surgery

## 2016-02-05 NOTE — Telephone Encounter (Signed)
Patient has called and stated that she is having abdominal cramping and nausea since the beginning of the week. She is on Chantix and stated that this has started since she increased her dose on Chantix at the beginning of the week. She is also taking a medication for chemo treatment for 5 years. Please call patient with her concerns.

## 2016-02-06 NOTE — Telephone Encounter (Signed)
Returned phone call to patient after speaking with Surgeon yesterday afternoon. I explained that she should not be having abdominal pain and nausea with this medication as this is not typical adverse reaction from this. However, patient informs me that she is still smoking. I did inform her that if this is the case, the medication is doing exactly what it is supposed to do in that it makes cigarettes repulsive and will give her nausea. Asked patient that if she is serious about quitting smoking, that she resume Chantix and stop smoking and see if this does not help with her symptoms.  She verbalizes understanding of this.

## 2016-02-11 ENCOUNTER — Inpatient Hospital Stay: Payer: 59 | Attending: Oncology | Admitting: Oncology

## 2016-02-11 VITALS — BP 115/78 | HR 60 | Temp 96.9°F | Resp 16 | Wt 136.5 lb

## 2016-02-11 DIAGNOSIS — Z79899 Other long term (current) drug therapy: Secondary | ICD-10-CM | POA: Diagnosis not present

## 2016-02-11 DIAGNOSIS — I10 Essential (primary) hypertension: Secondary | ICD-10-CM | POA: Diagnosis not present

## 2016-02-11 DIAGNOSIS — Z9011 Acquired absence of right breast and nipple: Secondary | ICD-10-CM | POA: Insufficient documentation

## 2016-02-11 DIAGNOSIS — Z1239 Encounter for other screening for malignant neoplasm of breast: Secondary | ICD-10-CM

## 2016-02-11 DIAGNOSIS — Z1501 Genetic susceptibility to malignant neoplasm of breast: Secondary | ICD-10-CM | POA: Diagnosis not present

## 2016-02-11 DIAGNOSIS — Z780 Asymptomatic menopausal state: Secondary | ICD-10-CM | POA: Diagnosis not present

## 2016-02-11 DIAGNOSIS — Z853 Personal history of malignant neoplasm of breast: Secondary | ICD-10-CM | POA: Insufficient documentation

## 2016-02-11 DIAGNOSIS — Z87898 Personal history of other specified conditions: Secondary | ICD-10-CM | POA: Diagnosis not present

## 2016-02-11 DIAGNOSIS — F17210 Nicotine dependence, cigarettes, uncomplicated: Secondary | ICD-10-CM | POA: Diagnosis not present

## 2016-02-11 DIAGNOSIS — Z862 Personal history of diseases of the blood and blood-forming organs and certain disorders involving the immune mechanism: Secondary | ICD-10-CM | POA: Diagnosis not present

## 2016-02-11 DIAGNOSIS — Z79810 Long term (current) use of selective estrogen receptor modulators (SERMs): Secondary | ICD-10-CM | POA: Insufficient documentation

## 2016-02-11 NOTE — Progress Notes (Signed)
Patient here for high risk management.  Dr. Azalee Course prescribed Tamoxifen and she is tolerating with no problems.

## 2016-02-21 NOTE — Progress Notes (Signed)
[place]  [phone] [phone]  ID: [name] OB: 1961-09-12  MR#: [id number]  [id number]  Patient Care Team: No Pcp Per Patient as PCP - General (General Practice)  CHIEF COMPLAINT:  Chief Complaint  Patient presents with  . New Evaluation    high risk for breast cancer    INTERVAL HISTORY: Patient is a 55 year old female who recently underwent left lumpectomy for atypical ductal hyperplasia. No DCIS or invasive malignancy was seen in her pathology. She also has a history of LCIS several years ago in her right breast. She was placed on tamoxifen prophylaxis and has been referred to clinic for further evaluation of her high risk breast disease. She is tolerating tamoxifen well without significant side effects. She has no neurologic complaints. She denies any recent fevers or illnesses. She has good appetite and denies weight loss. She denies any pain. She has no chest pain or shortness of breath. She denies any nausea, vomiting, constipation, or diarrhea. She has no urinary complaints. Patient feels at her baseline and offers no specific complaints today.  REVIEW OF SYSTEMS:   Review of Systems  Constitutional: Negative.  Negative for fever, weight loss and malaise/fatigue.  Respiratory: Negative.  Negative for shortness of breath.   Cardiovascular: Negative.  Negative for chest pain.  Gastrointestinal: Negative.   Genitourinary: Negative.   Musculoskeletal: Negative.   Neurological: Negative.  Negative for weakness.    As per HPI. Otherwise, a complete review of systems is negatve.  PAST MEDICAL HISTORY: Past Medical History  Diagnosis Date  . Hypertension   . Postmenopausal 2013  . Anemia     H/O  . Breast neoplasm, Tis (LCIS) 01/19/2011    Right breast    PAST SURGICAL HISTORY: Past Surgical History  Procedure Laterality Date  . Breast surgery Right 10/26/2013    Partial Mastectomy  . [place] ablation  2007  .  Breast biopsy Left 10/06/2015    ADH  . Breast lumpectomy with needle localization Left 01/07/2016    Procedure: BREAST LUMPECTOMY WITH NEEDLE LOCALIZATION;  Surgeon: Hubbard Robinson, MD;  Location: [facility] ORS;  Service: General;  Laterality: Left;    FAMILY HISTORY Family History  Problem Relation Age of Onset  . Leukemia Mother 65  . Heart disease Father   . Hypertension Sister   . Hypertension Brother        ADVANCED DIRECTIVES:    HEALTH MAINTENANCE: Social History  Substance Use Topics  . Smoking status: Current Every Day Smoker -- 0.50 packs/day for 0 years    Types: Cigarettes  . Smokeless tobacco: Not on file     Comment: ocassional smoker  . Alcohol Use: 0.0 oz/week    0 Standard drinks or equivalent per week     Comment: wine OCC     Colonoscopy:  PAP:  Bone density:  Lipid panel:  No Known Allergies  Current Outpatient Prescriptions  Medication Sig Dispense Refill  . acetaminophen (TYLENOL) 500 MG tablet Take 500 mg by mouth as needed.    . hydrochlorothiazide (HYDRODIURIL) 25 MG tablet Take 1 tablet by mouth daily.     Marland Kitchen lisinopril (PRINIVIL,ZESTRIL) 10 MG tablet Take 10 mg by mouth every morning.  0  . tamoxifen (NOLVADEX) 10 MG tablet Take 1 tablet (10 mg total) by mouth 2 (two) times daily. 60 tablet 11  . traZODone (DESYREL) 100 MG tablet Take 1 tablet by mouth at bedtime.   0  . zolpidem (AMBIEN) 10 MG tablet  Take 1 tablet by mouth at bedtime.   0   No current facility-administered medications for this visit.    OBJECTIVE: Filed Vitals:   02/11/16 1519  BP: 115/78  Pulse: 60  Temp: 96.9 F (36.1 C)  Resp: 16     Body mass index is 21.37 kg/(m^2).    ECOG FS:0 - Asymptomatic  General: Well-developed, well-nourished, no acute distress. Eyes: Pink conjunctiva, anicteric sclera. HEENT: Normocephalic, moist mucous membranes, clear oropharnyx. Breasts: Exam deferred today. Lungs: Clear to auscultation bilaterally. Heart: Regular rate and  rhythm. No rubs, murmurs, or gallops. Abdomen: Soft, nontender, nondistended. No organomegaly noted, normoactive bowel sounds. Musculoskeletal: No edema, cyanosis, or clubbing. Neuro: Alert, answering all questions appropriately. Cranial nerves grossly intact. Skin: No rashes or petechiae noted. Psych: Normal affect. Lymphatics: No cervical, calvicular, axillary or inguinal LAD.   LAB RESULTS:  Lab Results  Component Value Date   NA 137 01/02/2016   K 3.6 01/02/2016   CL 98* 01/02/2016   CO2 29 01/02/2016   GLUCOSE 99 01/02/2016   BUN 8 01/02/2016   CREATININE 0.68 01/02/2016   CALCIUM 10.0 01/02/2016   PROT 7.6 01/02/2016   ALBUMIN 5.0 01/02/2016   AST 20 01/02/2016   ALT 14 01/02/2016   ALKPHOS 55 01/02/2016   BILITOT 0.5 01/02/2016   GFRNONAA >60 01/02/2016   GFRAA >60 01/02/2016    Lab Results  Component Value Date   WBC 6.0 01/02/2016   NEUTROABS 3.9 01/02/2016   HGB 15.1 01/02/2016   HCT 42.9 01/02/2016   MCV 90.8 01/02/2016   PLT 213 01/02/2016     STUDIES: No results found.  ASSESSMENT: High risk breast disease.  PLAN:    1. High risk breast disease: Although final pathology did not reveal any DCIS or invasive breast cancer, atypical ductal hyperplasia as well as a history of LCIS places patient at significant increased risk of developing invasive breast cancer in the future. Agree with tamoxifen prophylaxis for a total of 5 years which she will complete in March 2022. Previously the risks of tamoxifen prophylaxis were discussed and repeated again today including endometrial cancer as well as DVT. Patient's current tobacco use also puts her at risk for DVT and she has expressed interest in quitting. She also expressed understanding that she will need to continue her routine mammograms as scheduled. No further intervention is needed at this time. No follow-up has been scheduled.  Approximate 45 minutes was spent in discussion of which greater than 50% was  consultation.  Patient expressed understanding and was in agreement with this plan. She also understands that She can call clinic at any time with any questions, concerns, or complaints.    Lloyd Huger, MD   02/21/2016 9:39 AM

## 2016-02-26 ENCOUNTER — Other Ambulatory Visit: Payer: Self-pay | Admitting: Obstetrics and Gynecology

## 2016-02-26 DIAGNOSIS — N6002 Solitary cyst of left breast: Secondary | ICD-10-CM

## 2016-03-26 ENCOUNTER — Ambulatory Visit
Admission: RE | Admit: 2016-03-26 | Discharge: 2016-03-26 | Disposition: A | Discharge status: Home / Self Care | Payer: 59 | Source: Ambulatory Visit | Attending: Obstetrics and Gynecology | Admitting: Obstetrics and Gynecology

## 2016-03-26 DIAGNOSIS — N6002 Solitary cyst of left breast: Secondary | ICD-10-CM | POA: Insufficient documentation

## 2016-03-26 DIAGNOSIS — Z9889 Other specified postprocedural states: Secondary | ICD-10-CM | POA: Insufficient documentation

## 2016-07-22 ENCOUNTER — Telehealth: Payer: Self-pay

## 2016-07-22 DIAGNOSIS — Z1239 Encounter for other screening for malignant neoplasm of breast: Secondary | ICD-10-CM

## 2016-07-22 NOTE — Telephone Encounter (Signed)
[place] and scheduled patient's Mammogram.  Appointment: 09/29/2016 at 9:20 AM at [facility]. Due to patient's insurance we had to wait until after October 21 st to order her mammogram. Appointment with Dr. Azalee Course is scheduled for 09/30/2016 at 9:00 AM at the [facility] office. I mailed patient a letter with both appointments.

## 2016-09-28 ENCOUNTER — Ambulatory Visit: Payer: Self-pay | Admitting: Surgery

## 2016-09-29 ENCOUNTER — Other Ambulatory Visit: Payer: Self-pay

## 2016-09-29 ENCOUNTER — Other Ambulatory Visit: Payer: Self-pay | Admitting: Surgery

## 2016-09-29 ENCOUNTER — Ambulatory Visit
Admission: RE | Admit: 2016-09-29 | Discharge: 2016-09-29 | Disposition: A | Discharge status: Home / Self Care | Payer: 59 | Source: Ambulatory Visit | Attending: Surgery | Admitting: Surgery

## 2016-09-29 ENCOUNTER — Ambulatory Visit: Payer: 59

## 2016-09-29 DIAGNOSIS — Z1239 Encounter for other screening for malignant neoplasm of breast: Secondary | ICD-10-CM

## 2016-09-30 ENCOUNTER — Ambulatory Visit: Payer: Self-pay | Admitting: Surgery

## 2016-10-01 ENCOUNTER — Ambulatory Visit
Admission: RE | Admit: 2016-10-01 | Discharge: 2016-10-01 | Disposition: A | Discharge status: Home / Self Care | Payer: 59 | Source: Ambulatory Visit | Attending: Surgery | Admitting: Surgery

## 2016-10-01 DIAGNOSIS — Z1239 Encounter for other screening for malignant neoplasm of breast: Secondary | ICD-10-CM

## 2016-10-01 DIAGNOSIS — Z1231 Encounter for screening mammogram for malignant neoplasm of breast: Secondary | ICD-10-CM | POA: Diagnosis not present

## 2016-10-01 HISTORY — DX: Malignant (primary) neoplasm, unspecified: C80.1

## 2016-10-06 ENCOUNTER — Other Ambulatory Visit: Payer: 59

## 2016-10-06 ENCOUNTER — Ambulatory Visit: Payer: 59

## 2016-10-20 ENCOUNTER — Ambulatory Visit (INDEPENDENT_AMBULATORY_CARE_PROVIDER_SITE_OTHER): Payer: 59 | Admitting: Surgery

## 2016-10-20 ENCOUNTER — Encounter: Payer: Self-pay | Admitting: Surgery

## 2016-10-20 VITALS — BP 123/83 | HR 80 | Temp 98.3°F | Wt 141.0 lb

## 2016-10-20 DIAGNOSIS — N6092 Unspecified benign mammary dysplasia of left breast: Secondary | ICD-10-CM

## 2016-10-20 NOTE — Patient Instructions (Addendum)
We will be contacting you in April 2018 to schedule your Unilateral Diagnostic Mammogram and Ultrasound. Then we will schedule your follow up appointment with Dr. Azalee Course.  Please Call your OB/GYN and let her know if there is any way that she could prescribe you some estrogen cream.  Please call us if you have any questions or concerns.  Happy Holidays!  I wish your grandson well.

## 2016-10-20 NOTE — Progress Notes (Signed)
Outpatient Surgical Follow Up  10/20/2016  Lori May is an 55 y.o. female seen for the diagnosis of Atypical ductal hyperplasia of left breast [N60.92].  HPI:  This is a 55 year old female with a previous history of LCIS in the right breast. Back in April she had a left breast atypical ductal hyperplasia that was removed. She has been on tamoxifen since that time and has been having hot flashes and some weight gain fatigue and some vaginal dryness that go along with menopause symptoms. The patient was in menopause for about 8 months prior to starting the tamoxifen. Patient states that her symptoms are improved from when she first started taking it however the vaginal dryness has gotten progressively worse over the past few months. The patient has seen her OB/GYN with the [facility] clinic Dr. Georga May who wanted to ensure she could prescribe estrogen creams without adverse reactions. The patient otherwise has not noticed any new breast lumps bumps or lymphadenopathy in her axilla.  Past Medical History:  Diagnosis Date  . Anemia    H/O  . 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 Cancer ([place]) [id number]   Bil breast  . Hypertension   . Personal history of chemotherapy   . Postmenopausal 2013    Past Surgical History:  Procedure Laterality Date  . BREAST BIOPSY Left 10/06/2015   ADH  . BREAST EXCISIONAL BIOPSY Left 01/2016   lumpectomy to remove ADH  . BREAST EXCISIONAL BIOPSY Right 10/26/2013   lumpectomy breast lobular carcinoma   . BREAST LUMPECTOMY WITH NEEDLE LOCALIZATION Left 01/07/2016   Procedure: BREAST LUMPECTOMY WITH NEEDLE LOCALIZATION;  Surgeon: Hubbard Robinson, MD;  Location: [facility] ORS;  Service: General;  Laterality: Left;  . BREAST SURGERY Right 10/26/2013   Partial Mastectomy  . [place] ABLATION  2007    Family History  Problem Relation Age of Onset  . Leukemia Mother 21  . Heart disease Father   . Hypertension Sister   . Hypertension  Brother     Social History:  reports that she quit smoking about 4 months ago. Her smoking use included Cigarettes. She smoked 0.50 packs per day for 0.00 years. She has never used smokeless tobacco. She reports that she drinks alcohol. She reports that she does not use drugs.  Allergies: No Known Allergies  Medications reviewed.  Review of Systems  Constitutional: Negative for chills, fever, malaise/fatigue and weight loss.  HENT: Negative for congestion.   Respiratory: Negative for cough and shortness of breath.   Cardiovascular: Negative for chest pain, palpitations and leg swelling.  Gastrointestinal: Negative for abdominal pain, constipation, diarrhea, heartburn, nausea and vomiting.  Genitourinary: Negative for dysuria, frequency and hematuria.  Musculoskeletal: Negative for back pain and myalgias.  Skin: Negative for itching and rash.  Neurological: Negative for dizziness and weakness.  Psychiatric/Behavioral: Negative for depression. The patient is not nervous/anxious.   All other systems reviewed and are negative.    Physical Exam:  BP 123/83   Pulse 80   Temp 98.3 F (36.8 C) (Oral)   Wt 141 lb (64 kg)   BMI 22.08 kg/m   Physical Exam  Constitutional: She is oriented to person, place, and time. She appears well-developed and well-nourished. No distress.  HENT:  Head: Normocephalic and atraumatic.  Right Ear: External ear normal.  Left Ear: External ear normal.  Nose: Nose normal.  Eyes: Conjunctivae and EOM are normal. Pupils are equal, round, and reactive to light. Right eye exhibits no  discharge. Left eye exhibits no discharge.  Neck: Normal range of motion. Neck supple. No thyromegaly present.  Cardiovascular: Normal rate, regular rhythm and normal heart sounds.  Exam reveals no friction rub.   No murmur heard. Pulmonary/Chest: Effort normal and breath sounds normal. No respiratory distress. She has no wheezes. She has no rales.  Abdominal: Soft. Bowel  sounds are normal. She exhibits no distension. There is no tenderness. There is no guarding.  Musculoskeletal: Normal range of motion. She exhibits no edema or deformity.  Neurological: She is alert and oriented to person, place, and time. No cranial nerve deficit. She exhibits normal muscle tone. Coordination normal.  Skin: Skin is warm and dry. No rash noted. No erythema. No pallor.  Psychiatric: She has a normal mood and affect. Her behavior is normal. Judgment and thought content normal.  Vitals reviewed. Breast exam:  Right breast: no skin changes, retraction or nipple discharge, no masses palpable, well healed periaerolar incision, no lymphadenopathy Left breast:   no skin changes, retraction or nipple discharge, no masses palpable, well healed periaerolar incision, no lymphadenopathy   Mammogram:  10/01/16 RECOMMENDATION: Bilateral diagnostic mammogram in 1 year.  I have discussed the findings and recommendations with the patient. Results were also provided in writing at the conclusion of the visit. If applicable, a reminder letter will be sent to the patient regarding the next appointment.  BI-RADS CATEGORY  2: Benign.   Assessment/Plan: Lori May is an 55 y.o. female seen for the diagnosis of Atypical ductal hyperplasia of left breast [N60.92] and a history of LCIS. She is on tamoxifen due to being high wrist is to continue on this for 5 years. This is her 6 month follow-up from having her left breast ADH removed and the mammogram is a BI-RADS 2. I will have her follow-up in 6 months for her left breast mammogram which will be her yearly appointment follow-up. I will also send a message to her OB/GYN that she can order any topical creams even with estrogen that she feels is appropriate for the vaginal dryness. Patient is to call with any questions or concerns.  Eilyn Polack L. Ever Gustafson MD General Surgeon  10/20/2016,3:41 PM

## 2017-03-14 ENCOUNTER — Telehealth: Payer: Self-pay

## 2017-03-14 DIAGNOSIS — N6092 Unspecified benign mammary dysplasia of left breast: Secondary | ICD-10-CM

## 2017-03-14 NOTE — Telephone Encounter (Signed)
I scheduled patient's mammogram and U/S for 04/05/2017 and follow up with Dr. Adonis Huguenin on 04/20/2017. I will send patient her appointments.

## 2017-03-15 ENCOUNTER — Other Ambulatory Visit: Payer: Self-pay | Admitting: Obstetrics and Gynecology

## 2017-03-15 DIAGNOSIS — Z13820 Encounter for screening for osteoporosis: Secondary | ICD-10-CM

## 2017-04-05 ENCOUNTER — Other Ambulatory Visit: Payer: 59

## 2017-04-05 ENCOUNTER — Ambulatory Visit: Payer: 59

## 2017-04-05 ENCOUNTER — Telehealth: Payer: Self-pay

## 2017-04-05 NOTE — Telephone Encounter (Signed)
Patient called to cancel her appointment with Dr. Adonis Huguenin and stated that "she would like to cancel this appt and does not want to reschedule at this time".

## 2017-04-18 ENCOUNTER — Ambulatory Visit
Admission: RE | Admit: 2017-04-18 | Discharge: 2017-04-18 | Disposition: A | Discharge status: Home / Self Care | Payer: 59 | Source: Ambulatory Visit | Attending: General Surgery | Admitting: General Surgery

## 2017-04-18 DIAGNOSIS — N6092 Unspecified benign mammary dysplasia of left breast: Secondary | ICD-10-CM

## 2017-04-20 ENCOUNTER — Ambulatory Visit: Payer: Self-pay | Admitting: General Surgery

## 2017-04-25 ENCOUNTER — Telehealth: Payer: Self-pay

## 2017-04-25 NOTE — Telephone Encounter (Signed)
After reviewing patient's Mammogram results that were done on 04/18/2017 I called patient to try to reschedule her appointment with Dr. Adonis Huguenin. However, she did not pick up and was not able to leave a voicemail. Therefore, I will send her a letter to please call us back and reschedule her appointment with Dr. Adonis Huguenin as soon as possible.

## 2017-09-14 ENCOUNTER — Other Ambulatory Visit: Payer: Self-pay | Admitting: Obstetrics and Gynecology

## 2017-09-14 DIAGNOSIS — Z1231 Encounter for screening mammogram for malignant neoplasm of breast: Secondary | ICD-10-CM

## 2017-10-10 ENCOUNTER — Other Ambulatory Visit: Payer: Self-pay | Admitting: Obstetrics and Gynecology

## 2017-10-10 DIAGNOSIS — Z86000 Personal history of in-situ neoplasm of breast: Secondary | ICD-10-CM

## 2017-11-16 ENCOUNTER — Ambulatory Visit
Admission: RE | Admit: 2017-11-16 | Discharge: 2017-11-16 | Disposition: A | Discharge status: Home / Self Care | Payer: 59 | Source: Ambulatory Visit | Attending: Obstetrics and Gynecology | Admitting: Obstetrics and Gynecology

## 2017-11-16 DIAGNOSIS — Z86000 Personal history of in-situ neoplasm of breast: Secondary | ICD-10-CM

## 2018-11-16 ENCOUNTER — Other Ambulatory Visit: Payer: Self-pay | Admitting: Obstetrics and Gynecology

## 2018-11-16 DIAGNOSIS — Z1231 Encounter for screening mammogram for malignant neoplasm of breast: Secondary | ICD-10-CM

## 2019-02-14 ENCOUNTER — Other Ambulatory Visit: Payer: Self-pay | Admitting: Obstetrics and Gynecology

## 2019-02-14 DIAGNOSIS — Z86000 Personal history of in-situ neoplasm of breast: Secondary | ICD-10-CM

## 2019-02-14 DIAGNOSIS — Z1231 Encounter for screening mammogram for malignant neoplasm of breast: Secondary | ICD-10-CM

## 2019-02-15 ENCOUNTER — Other Ambulatory Visit: Payer: Self-pay | Admitting: Obstetrics and Gynecology

## 2020-04-12 ENCOUNTER — Encounter: Payer: Self-pay | Admitting: Nurse Practitioner

## 2020-04-12 DIAGNOSIS — Z853 Personal history of malignant neoplasm of breast: Secondary | ICD-10-CM | POA: Insufficient documentation

## 2020-04-16 ENCOUNTER — Ambulatory Visit (INDEPENDENT_AMBULATORY_CARE_PROVIDER_SITE_OTHER): Payer: 59 | Admitting: Nurse Practitioner

## 2020-04-16 ENCOUNTER — Encounter: Payer: Self-pay | Admitting: Nurse Practitioner

## 2020-04-16 ENCOUNTER — Other Ambulatory Visit: Payer: Self-pay

## 2020-04-16 VITALS — BP 120/81 | HR 72 | Temp 98.1°F | Ht 65.5 in | Wt 153.4 lb

## 2020-04-16 DIAGNOSIS — I10 Essential (primary) hypertension: Secondary | ICD-10-CM | POA: Diagnosis not present

## 2020-04-16 DIAGNOSIS — Z853 Personal history of malignant neoplasm of breast: Secondary | ICD-10-CM | POA: Diagnosis not present

## 2020-04-16 DIAGNOSIS — F5104 Psychophysiologic insomnia: Secondary | ICD-10-CM

## 2020-04-16 DIAGNOSIS — Z7689 Persons encountering health services in other specified circumstances: Secondary | ICD-10-CM

## 2020-04-16 DIAGNOSIS — E663 Overweight: Secondary | ICD-10-CM | POA: Insufficient documentation

## 2020-04-16 DIAGNOSIS — F411 Generalized anxiety disorder: Secondary | ICD-10-CM | POA: Insufficient documentation

## 2020-04-16 DIAGNOSIS — G4700 Insomnia, unspecified: Secondary | ICD-10-CM | POA: Insufficient documentation

## 2020-04-16 NOTE — Assessment & Plan Note (Signed)
Chronic, ongoing for several years.  Continue current medication regimen to include Ambien and Trazodone, discussed at length risks of long term Ambien use and that with age will need to reduce dose.  She agrees to UDS and controlled substance contract next visit + 3 month visits for refills.  Recommend continued focus on healthy sleep hygiene techniques.  Return in 4 weeks for annual physical.

## 2020-04-16 NOTE — Progress Notes (Signed)
New Patient Office Visit  Subjective:  Patient ID: Lori May, female    DOB: 06-20-1961  Age: 59 y.o. MRN: [id number]  CC:  Chief Complaint  Patient presents with  . Establish Care    no concerns    HPI Lori May presents for new patient visit to establish care.  Introduced to Designer, jewellery role and practice setting.  All questions answered.  Discussed provider/patient relationship and expectations.  Had to switch providers, [facility], due to not taking insurance -- was seeing her for 20 years.  Last had pap in beginning of 2020 -- 3 year schedule, on review of records she brought it appears last pap 2019 April.  Mammogram -- has to have diagnostic due to lumpectomy in both breasts -- has not had mammogram in a little while due to previous insurance not covering diagnostic -- took Tamoxifen for 3 years.     HYPERTENSION Currently taking HCTZ and Lisinopril daily. Has not had labs in 2 years.   Hypertension status: stable  Satisfied with current treatment? yes Duration of hypertension: chronic BP monitoring frequency:  rarely BP range: 120/80 at home, sometimes higher BP medication side effects:  no Medication compliance: good compliance Aspirin: no Recurrent headaches: no Visual changes: no Palpitations: no Dyspnea: no Chest pain: no Lower extremity edema: no Dizzy/lightheaded: no  ANXIETY/STRESS Lost a grandson 2 years ago at young age to brain tumor, that is when she was placed on Xanax and Buspar.  She reports trying different medications -- Zoloft and some others in past.  Pt made aware of risks of benzo medication use to include increased sedation, respiratory suppression, falls, dependence and cardiovascular events.  Pt would like to continue treatment as benefit determined to outweigh risk.    Has taken Ambien and Trazodone for insomnia, taken for > 10 years.  Discussed at length risks of long term Ambien use and that when age 26 will need to lower  dose to 5 MG.   Duration:stable Anxious mood: no  Excessive worrying: yes Irritability: no  Sweating: no Nausea: no Palpitations:no Hyperventilation: no Panic attacks: no Agoraphobia: no  Obscessions/compulsions: no Depressed mood: yes Depression screen [facility] 2/9 04/16/2020  Decreased Interest 0  Down, Depressed, Hopeless 1  PHQ - 2 Score 1  Altered sleeping 3  Tired, decreased energy 1  Change in appetite 0  Feeling bad or failure about yourself  0  Trouble concentrating 0  Moving slowly or fidgety/restless 0  Suicidal thoughts 0  PHQ-9 Score 5  Difficult doing work/chores Not difficult at all   Anhedonia: no Weight changes: no Insomnia: yes hard to fall asleep  Hypersomnia: no Fatigue/loss of energy: sometimes Feelings of worthlessness: no Feelings of guilt: no Impaired concentration/indecisiveness: no Suicidal ideations: no  Crying spells: yes Recent Stressors/Life Changes: no   Relationship problems: no   Family stress: no     Financial stress: no    Job stress: no    Recent death/loss: no GAD 7 : Generalized Anxiety Score 04/16/2020  Nervous, Anxious, on Edge 1  Control/stop worrying 3  Worry too much - different things 2  Trouble relaxing 0  Restless 0  Easily annoyed or irritable 1  Afraid - awful might happen 3  Total GAD 7 Score 10  Anxiety Difficulty Not difficult at all     Past Medical History:  Diagnosis Date  . Anemia    H/O  . 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  Cancer [facility]) 2014   right breast LCIS  . Hypertension   . Postmenopausal 2013    Past Surgical History:  Procedure Laterality Date  . BREAST BIOPSY Left 10/06/2015   ADH  . BREAST EXCISIONAL BIOPSY Left 01/2016   lumpectomy to remove ADH  . BREAST EXCISIONAL BIOPSY Right 10/26/2013   lumpectomy breast lobular carcinoma   . BREAST LUMPECTOMY Right 2014   LCIS  . BREAST LUMPECTOMY WITH NEEDLE LOCALIZATION Left 01/07/2016   Procedure: BREAST LUMPECTOMY  WITH NEEDLE LOCALIZATION;  Surgeon: Hubbard Robinson, MD;  Location: [facility] ORS;  Service: General;  Laterality: Left;  . BREAST SURGERY Right 10/26/2013   Partial Mastectomy  . [place] ABLATION  2007    Family History  Problem Relation Age of Onset  . Leukemia Mother 35  . Heart disease Father   . Hypertension Sister   . Hypertension Brother   . Hypertension Brother     Social History   Socioeconomic History  . Marital status: Married    Spouse name: Not on file  . Number of children: Not on file  . Years of education: Not on file  . Highest education level: Not on file  Occupational History  . Not on file  Tobacco Use  . Smoking status: Former Smoker    Packs/day: 0.50    Years: 0.00    Pack years: 0.00    Types: Cigarettes    Quit date: 06/09/2016    Years since quitting: 3.8  . Smokeless tobacco: Never Used  . Tobacco comment: ocassional smoker -- social smoker  Substance and Sexual Activity  . Alcohol use: Yes    Alcohol/week: 0.0 standard drinks    Comment: wine OCC  . Drug use: No  . Sexual activity: Yes  Other Topics Concern  . Not on file  Social History Narrative  . Not on file   Social Determinants of Health   Financial Resource Strain:   . Difficulty of Paying Living Expenses:   Food Insecurity:   . Worried About Charity fundraiser in the Last Year:   . Arboriculturist in the Last Year:   Transportation Needs: No Transportation Needs  . Lack of Transportation (Medical): No  . Lack of Transportation (Non-Medical): No  Physical Activity: Sufficiently Active  . Days of Exercise per Week: 6 days  . Minutes of Exercise per Session: 30 min  Stress:   . Feeling of Stress :   Social Connections:   . Frequency of Communication with Friends and Family:   . Frequency of Social Gatherings with Friends and Family:   . Attends Religious Services:   . Active Member of Clubs or Organizations:   . Attends Archivist Meetings:   Marland Kitchen Marital Status:    Intimate Partner Violence:   . Fear of Current or Ex-Partner:   . Emotionally Abused:   Marland Kitchen Physically Abused:   . Sexually Abused:     ROS Review of Systems  Constitutional: Negative for activity change, appetite change, diaphoresis, fatigue and fever.  Respiratory: Negative for cough, chest tightness and shortness of breath.   Cardiovascular: Negative for chest pain, palpitations and leg swelling.  Gastrointestinal: Negative.   Neurological: Negative.   Psychiatric/Behavioral: Positive for sleep disturbance. Negative for decreased concentration, self-injury and suicidal ideas. The patient is nervous/anxious.     Objective:   Today's Vitals: BP 120/81   Pulse 72   Temp 98.1 F (36.7 C)   Ht 5' 5.5" (1.664 m)  Wt 153 lb 6.4 oz (69.6 kg)   SpO2 98%   BMI 25.14 kg/m   Physical Exam Vitals and nursing note reviewed.  Constitutional:      General: She is awake. She is not in acute distress.    Appearance: She is well-developed, well-groomed and overweight. She is not ill-appearing.  HENT:     Head: Normocephalic.     Right Ear: Hearing normal.     Left Ear: Hearing normal.  Eyes:     General: Lids are normal.        Right eye: No discharge.        Left eye: No discharge.     Conjunctiva/sclera: Conjunctivae normal.     Pupils: Pupils are equal, round, and reactive to light.  Neck:     Thyroid: No thyromegaly.     Vascular: No carotid bruit.  Cardiovascular:     Rate and Rhythm: Normal rate and regular rhythm.     Heart sounds: Normal heart sounds. No murmur. No gallop.   Pulmonary:     Effort: Pulmonary effort is normal. No accessory muscle usage or respiratory distress.     Breath sounds: Normal breath sounds.  Abdominal:     General: Bowel sounds are normal.     Palpations: Abdomen is soft.  Musculoskeletal:     Cervical back: Normal range of motion and neck supple.     Right lower leg: No edema.     Left lower leg: No edema.  Lymphadenopathy:      Cervical: No cervical adenopathy.  Skin:    General: Skin is warm and dry.  Neurological:     Mental Status: She is alert and oriented to person, place, and time.  Psychiatric:        Attention and Perception: Attention normal.        Mood and Affect: Mood normal.        Speech: Speech normal.        Behavior: Behavior normal. Behavior is cooperative.        Thought Content: Thought content normal.     Assessment & Plan:   Problem List Items Addressed This Visit      Cardiovascular and Mediastinum   Essential hypertension    Chronic, stable with BP below goal today.  Continue current medication regimen and adjust as needed.  Recommend she continue to monitor BP at home, monitoring a few days a week and documenting for provider.  DASH diet focus at home.  Plan on labs in 4 weeks at physical, to include TSH, CMP, and lipid panel.          Other   History of left breast cancer    She is to contact her insurance and discuss with them, she reports she needs one more diagnostic mammogram.  Will order this at physical in 4 weeks, allowing her time to discuss coverage with her insurance.      Overweight (BMI 25.0-29.9)    Recommend focus on healthy diet changes and regular exercise regimen at home, creating small and achievable goals over longer periods of time.        Generalized anxiety disorder    Chronic, ongoing since loss of grandson 2 years ago.  Denies SI/HI.  Had at length discussion on risks of long term benzo use, she would like to continue treatment at this time. Continue current medication regimen and adjust as needed, Buspar and Xanax.  She agrees to UDS and controlled substance contract at  next visit, then 3 month visits for refills.  Would benefit from reduction of benzo in future and trial of alternate maintenance medication or increase in Buspar.  Return in 4 weeks for physical.      Relevant Medications   busPIRone (BUSPAR) 10 MG tablet   ALPRAZolam (XANAX) 0.25 MG  tablet   Insomnia    Chronic, ongoing for several years.  Continue current medication regimen to include Ambien and Trazodone, discussed at length risks of long term Ambien use and that with age will need to reduce dose.  She agrees to UDS and controlled substance contract next visit + 3 month visits for refills.  Recommend continued focus on healthy sleep hygiene techniques.  Return in 4 weeks for annual physical.       Other Visit Diagnoses    Encounter to establish care    -  Primary      Outpatient Encounter Medications as of 04/16/2020  Medication Sig  . acetaminophen (TYLENOL) 500 MG tablet Take 500 mg by mouth as needed.  . ALPRAZolam (XANAX) 0.25 MG tablet Take 0.25 mg by mouth 2 (two) times daily.  . busPIRone (BUSPAR) 10 MG tablet Take 10 mg by mouth daily.  . hydrochlorothiazide (HYDRODIURIL) 25 MG tablet Take 1 tablet by mouth daily.   Marland Kitchen lisinopril (PRINIVIL,ZESTRIL) 10 MG tablet Take 10 mg by mouth every morning.  . traZODone (DESYREL) 100 MG tablet Take 1 tablet by mouth at bedtime.   Marland Kitchen zolpidem (AMBIEN) 10 MG tablet Take 1 tablet by mouth at bedtime.   . [DISCONTINUED] tamoxifen (NOLVADEX) 10 MG tablet Take 1 tablet (10 mg total) by mouth 2 (two) times daily.   No facility-administered encounter medications on file as of 04/16/2020.    Follow-up: Return in about 4 weeks (around 05/14/2020) for Annual physical.   Venita Lick, NP

## 2020-04-16 NOTE — Patient Instructions (Signed)
DASH Eating Plan DASH stands for "Dietary Approaches to Stop Hypertension." The DASH eating plan is a healthy eating plan that has been shown to reduce high blood pressure (hypertension). It may also reduce your risk for type 2 diabetes, heart disease, and stroke. The DASH eating plan may also help with weight loss. What are tips for following this plan?  General guidelines  Avoid eating more than 2,300 mg (milligrams) of salt (sodium) a day. If you have hypertension, you may need to reduce your sodium intake to 1,500 mg a day.  Limit alcohol intake to no more than 1 drink a day for nonpregnant women and 2 drinks a day for men. One drink equals 12 oz of beer, 5 oz of wine, or 1 oz of hard liquor.  Work with your health care provider to maintain a healthy body weight or to lose weight. Ask what an ideal weight is for you.  Get at least 30 minutes of exercise that causes your heart to beat faster (aerobic exercise) most days of the week. Activities may include walking, swimming, or biking.  Work with your health care provider or diet and nutrition specialist (dietitian) to adjust your eating plan to your individual calorie needs. Reading food labels   Check food labels for the amount of sodium per serving. Choose foods with less than 5 percent of the Daily Value of sodium. Generally, foods with less than 300 mg of sodium per serving fit into this eating plan.  To find whole grains, look for the word "whole" as the first word in the ingredient list. Shopping  Buy products labeled as "low-sodium" or "no salt added."  Buy fresh foods. Avoid canned foods and premade or frozen meals. Cooking  Avoid adding salt when cooking. Use salt-free seasonings or herbs instead of table salt or sea salt. Check with your health care provider or pharmacist before using salt substitutes.  Do not fry foods. Cook foods using healthy methods such as baking, boiling, grilling, and broiling instead.  Cook with  heart-healthy oils, such as olive, canola, soybean, or sunflower oil. Meal planning  Eat a balanced diet that includes: ? 5 or more servings of fruits and vegetables each day. At each meal, try to fill half of your plate with fruits and vegetables. ? Up to 6-8 servings of whole grains each day. ? Less than 6 oz of lean meat, poultry, or fish each day. A 3-oz serving of meat is about the same size as a deck of cards. One egg equals 1 oz. ? 2 servings of low-fat dairy each day. ? A serving of nuts, seeds, or beans 5 times each week. ? Heart-healthy fats. Healthy fats called Omega-3 fatty acids are found in foods such as flaxseeds and coldwater fish, like sardines, salmon, and mackerel.  Limit how much you eat of the following: ? Canned or prepackaged foods. ? Food that is high in trans fat, such as fried foods. ? Food that is high in saturated fat, such as fatty meat. ? Sweets, desserts, sugary drinks, and other foods with added sugar. ? Full-fat dairy products.  Do not salt foods before eating.  Try to eat at least 2 vegetarian meals each week.  Eat more home-cooked food and less restaurant, buffet, and fast food.  When eating at a restaurant, ask that your food be prepared with less salt or no salt, if possible. What foods are recommended? The items listed may not be a complete list. Talk with your dietitian about   what dietary choices are best for you. Grains Whole-grain or whole-wheat bread. Whole-grain or whole-wheat pasta. Brown rice. Oatmeal. Quinoa. Bulgur. Whole-grain and low-sodium cereals. Pita bread. Low-fat, low-sodium crackers. Whole-wheat flour tortillas. Vegetables Fresh or frozen vegetables (raw, steamed, roasted, or grilled). Low-sodium or reduced-sodium tomato and vegetable juice. Low-sodium or reduced-sodium tomato sauce and tomato paste. Low-sodium or reduced-sodium canned vegetables. Fruits All fresh, dried, or frozen fruit. Canned fruit in natural juice (without  added sugar). Meat and other protein foods Skinless chicken or turkey. Ground chicken or turkey. Pork with fat trimmed off. Fish and seafood. Egg whites. Dried beans, peas, or lentils. Unsalted nuts, nut butters, and seeds. Unsalted canned beans. Lean cuts of beef with fat trimmed off. Low-sodium, lean deli meat. Dairy Low-fat (1%) or fat-free (skim) milk. Fat-free, low-fat, or reduced-fat cheeses. Nonfat, low-sodium ricotta or cottage cheese. Low-fat or nonfat yogurt. Low-fat, low-sodium cheese. Fats and oils Soft margarine without trans fats. Vegetable oil. Low-fat, reduced-fat, or light mayonnaise and salad dressings (reduced-sodium). Canola, safflower, olive, soybean, and sunflower oils. Avocado. Seasoning and other foods Herbs. Spices. Seasoning mixes without salt. Unsalted popcorn and pretzels. Fat-free sweets. What foods are not recommended? The items listed may not be a complete list. Talk with your dietitian about what dietary choices are best for you. Grains Baked goods made with fat, such as croissants, muffins, or some breads. Dry pasta or rice meal packs. Vegetables Creamed or fried vegetables. Vegetables in a cheese sauce. Regular canned vegetables (not low-sodium or reduced-sodium). Regular canned tomato sauce and paste (not low-sodium or reduced-sodium). Regular tomato and vegetable juice (not low-sodium or reduced-sodium). Pickles. Olives. Fruits Canned fruit in a light or heavy syrup. Fried fruit. Fruit in cream or butter sauce. Meat and other protein foods Fatty cuts of meat. Ribs. Fried meat. Bacon. Sausage. Bologna and other processed lunch meats. Salami. Fatback. Hotdogs. Bratwurst. Salted nuts and seeds. Canned beans with added salt. Canned or smoked fish. Whole eggs or egg yolks. Chicken or turkey with skin. Dairy Whole or 2% milk, cream, and half-and-half. Whole or full-fat cream cheese. Whole-fat or sweetened yogurt. Full-fat cheese. Nondairy creamers. Whipped toppings.  Processed cheese and cheese spreads. Fats and oils Butter. Stick margarine. Lard. Shortening. Ghee. Bacon fat. Tropical oils, such as coconut, palm kernel, or palm oil. Seasoning and other foods Salted popcorn and pretzels. Onion salt, garlic salt, seasoned salt, table salt, and sea salt. Worcestershire sauce. Tartar sauce. Barbecue sauce. Teriyaki sauce. Soy sauce, including reduced-sodium. Steak sauce. Canned and packaged gravies. Fish sauce. Oyster sauce. Cocktail sauce. Horseradish that you find on the shelf. Ketchup. Mustard. Meat flavorings and tenderizers. Bouillon cubes. Hot sauce and Tabasco sauce. Premade or packaged marinades. Premade or packaged taco seasonings. Relishes. Regular salad dressings. Where to find more information:  National Heart, Lung, and Blood Institute: www.nhlbi.nih.gov  American Heart Association: www.heart.org Summary  The DASH eating plan is a healthy eating plan that has been shown to reduce high blood pressure (hypertension). It may also reduce your risk for type 2 diabetes, heart disease, and stroke.  With the DASH eating plan, you should limit salt (sodium) intake to 2,300 mg a day. If you have hypertension, you may need to reduce your sodium intake to 1,500 mg a day.  When on the DASH eating plan, aim to eat more fresh fruits and vegetables, whole grains, lean proteins, low-fat dairy, and heart-healthy fats.  Work with your health care provider or diet and nutrition specialist (dietitian) to adjust your eating plan to your   individual calorie needs. This information is not intended to replace advice given to you by your health care provider. Make sure you discuss any questions you have with your health care provider. Document Revised: 11/04/2017 Document Reviewed: 11/15/2016 Elsevier Patient Education  2020 Elsevier Inc.  

## 2020-04-16 NOTE — Assessment & Plan Note (Signed)
She is to contact her insurance and discuss with them, she reports she needs one more diagnostic mammogram.  Will order this at physical in 4 weeks, allowing her time to discuss coverage with her insurance.

## 2020-04-16 NOTE — Assessment & Plan Note (Signed)
Recommend focus on healthy diet changes and regular exercise regimen at home, creating small and achievable goals over longer periods of time.

## 2020-04-16 NOTE — Assessment & Plan Note (Signed)
Chronic, ongoing since loss of grandson 2 years ago.  Denies SI/HI.  Had at length discussion on risks of long term benzo use, she would like to continue treatment at this time. Continue current medication regimen and adjust as needed, Buspar and Xanax.  She agrees to UDS and controlled substance contract at next visit, then 3 month visits for refills.  Would benefit from reduction of benzo in future and trial of alternate maintenance medication or increase in Buspar.  Return in 4 weeks for physical.

## 2020-04-16 NOTE — Assessment & Plan Note (Signed)
Chronic, stable with BP below goal today.  Continue current medication regimen and adjust as needed.  Recommend she continue to monitor BP at home, monitoring a few days a week and documenting for provider.  DASH diet focus at home.  Plan on labs in 4 weeks at physical, to include TSH, CMP, and lipid panel.

## 2020-05-14 ENCOUNTER — Encounter: Payer: Self-pay | Admitting: Nurse Practitioner

## 2020-05-14 ENCOUNTER — Other Ambulatory Visit: Payer: Self-pay

## 2020-05-14 ENCOUNTER — Ambulatory Visit (INDEPENDENT_AMBULATORY_CARE_PROVIDER_SITE_OTHER): Payer: 59 | Admitting: Nurse Practitioner

## 2020-05-14 VITALS — BP 134/87 | HR 63 | Temp 98.5°F | Ht 65.5 in | Wt 151.0 lb

## 2020-05-14 DIAGNOSIS — F5104 Psychophysiologic insomnia: Secondary | ICD-10-CM | POA: Diagnosis not present

## 2020-05-14 DIAGNOSIS — Z0000 Encounter for general adult medical examination without abnormal findings: Secondary | ICD-10-CM

## 2020-05-14 DIAGNOSIS — Z1211 Encounter for screening for malignant neoplasm of colon: Secondary | ICD-10-CM | POA: Diagnosis not present

## 2020-05-14 DIAGNOSIS — Z1231 Encounter for screening mammogram for malignant neoplasm of breast: Secondary | ICD-10-CM

## 2020-05-14 DIAGNOSIS — Z853 Personal history of malignant neoplasm of breast: Secondary | ICD-10-CM

## 2020-05-14 DIAGNOSIS — I10 Essential (primary) hypertension: Secondary | ICD-10-CM | POA: Diagnosis not present

## 2020-05-14 DIAGNOSIS — F411 Generalized anxiety disorder: Secondary | ICD-10-CM | POA: Diagnosis not present

## 2020-05-14 DIAGNOSIS — Z79899 Other long term (current) drug therapy: Secondary | ICD-10-CM | POA: Insufficient documentation

## 2020-05-14 MED ORDER — ALPRAZOLAM 0.25 MG PO TABS: 0.2500 mg | ORAL_TABLET | Freq: Two times a day (BID) | ORAL | 3 refills | Status: DC

## 2020-05-14 MED ORDER — LISINOPRIL 10 MG PO TABS: 10.0000 mg | ORAL_TABLET | Freq: Every morning | ORAL | 4 refills | Status: DC

## 2020-05-14 MED ORDER — TRAZODONE HCL 100 MG PO TABS: 100.0000 mg | ORAL_TABLET | Freq: Every day | ORAL | 4 refills | Status: DC

## 2020-05-14 MED ORDER — BUSPIRONE HCL 10 MG PO TABS: 10.0000 mg | ORAL_TABLET | Freq: Every day | ORAL | 4 refills | Status: DC

## 2020-05-14 MED ORDER — ZOLPIDEM TARTRATE 10 MG PO TABS: 10.0000 mg | ORAL_TABLET | Freq: Every day | ORAL | 3 refills | Status: DC

## 2020-05-14 MED ORDER — HYDROCHLOROTHIAZIDE 25 MG PO TABS: 25.0000 mg | ORAL_TABLET | Freq: Every day | ORAL | 4 refills | Status: DC

## 2020-05-14 NOTE — Patient Instructions (Signed)
Health Maintenance, Female Adopting a healthy lifestyle and getting preventive care are important in promoting health and wellness. Ask your health care provider about:  The right schedule for you to have regular tests and exams.  Things you can do on your own to prevent diseases and keep yourself healthy. What should I know about diet, weight, and exercise? Eat a healthy diet   Eat a diet that includes plenty of vegetables, fruits, low-fat dairy products, and lean protein.  Do not eat a lot of foods that are high in solid fats, added sugars, or sodium. Maintain a healthy weight Body mass index (BMI) is used to identify weight problems. It estimates body fat based on height and weight. Your health care provider can help determine your BMI and help you achieve or maintain a healthy weight. Get regular exercise Get regular exercise. This is one of the most important things you can do for your health. Most adults should:  Exercise for at least 150 minutes each week. The exercise should increase your heart rate and make you sweat (moderate-intensity exercise).  Do strengthening exercises at least twice a week. This is in addition to the moderate-intensity exercise.  Spend less time sitting. Even light physical activity can be beneficial. Watch cholesterol and blood lipids Have your blood tested for lipids and cholesterol at 60 years of age, then have this test every 5 years. Have your cholesterol levels checked more often if:  Your lipid or cholesterol levels are high.  You are older than 60 years of age.  You are at high risk for heart disease. What should I know about cancer screening? Depending on your health history and family history, you may need to have cancer screening at various ages. This may include screening for:  Breast cancer.  Cervical cancer.  Colorectal cancer.  Skin cancer.  Lung cancer. What should I know about heart disease, diabetes, and high blood  pressure? Blood pressure and heart disease  High blood pressure causes heart disease and increases the risk of stroke. This is more likely to develop in people who have high blood pressure readings, are of African descent, or are overweight.  Have your blood pressure checked: ? Every 3-5 years if you are 18-39 years of age. ? Every year if you are 40 years old or older. Diabetes Have regular diabetes screenings. This checks your fasting blood sugar level. Have the screening done:  Once every three years after age 40 if you are at a normal weight and have a low risk for diabetes.  More often and at a younger age if you are overweight or have a high risk for diabetes. What should I know about preventing infection? Hepatitis B If you have a higher risk for hepatitis B, you should be screened for this virus. Talk with your health care provider to find out if you are at risk for hepatitis B infection. Hepatitis C Testing is recommended for:  Everyone born from 1945 through 1965.  Anyone with known risk factors for hepatitis C. Sexually transmitted infections (STIs)  Get screened for STIs, including gonorrhea and chlamydia, if: ? You are sexually active and are younger than 60 years of age. ? You are older than 60 years of age and your health care provider tells you that you are at risk for this type of infection. ? Your sexual activity has changed since you were last screened, and you are at increased risk for chlamydia or gonorrhea. Ask your health care provider if   you are at risk.  Ask your health care provider about whether you are at high risk for HIV. Your health care provider may recommend a prescription medicine to help prevent HIV infection. If you choose to take medicine to prevent HIV, you should first get tested for HIV. You should then be tested every 3 months for as long as you are taking the medicine. Pregnancy  If you are about to stop having your period (premenopausal) and  you may become pregnant, seek counseling before you get pregnant.  Take 400 to 800 micrograms (mcg) of folic acid every day if you become pregnant.  Ask for birth control (contraception) if you want to prevent pregnancy. Osteoporosis and menopause Osteoporosis is a disease in which the bones lose minerals and strength with aging. This can result in bone fractures. If you are 65 years old or older, or if you are at risk for osteoporosis and fractures, ask your health care provider if you should:  Be screened for bone loss.  Take a calcium or vitamin D supplement to lower your risk of fractures.  Be given hormone replacement therapy (HRT) to treat symptoms of menopause. Follow these instructions at home: Lifestyle  Do not use any products that contain nicotine or tobacco, such as cigarettes, e-cigarettes, and chewing tobacco. If you need help quitting, ask your health care provider.  Do not use street drugs.  Do not share needles.  Ask your health care provider for help if you need support or information about quitting drugs. Alcohol use  Do not drink alcohol if: ? Your health care provider tells you not to drink. ? You are pregnant, may be pregnant, or are planning to become pregnant.  If you drink alcohol: ? Limit how much you use to 0-1 drink a day. ? Limit intake if you are breastfeeding.  Be aware of how much alcohol is in your drink. In the [place] one drink equals one 12 oz bottle of beer (355 mL), one 5 oz glass of wine (148 mL), or one 1 oz glass of hard liquor (44 mL). General instructions  Schedule regular health, dental, and eye exams.  Stay current with your vaccines.  Tell your health care provider if: ? You often feel depressed. ? You have ever been abused or do not feel safe at home. Summary  Adopting a healthy lifestyle and getting preventive care are important in promoting health and wellness.  Follow your health care provider's instructions about healthy  diet, exercising, and getting tested or screened for diseases.  Follow your health care provider's instructions on monitoring your cholesterol and blood pressure. This information is not intended to replace advice given to you by your health care provider. Make sure you discuss any questions you have with your health care provider. Document Revised: 11/15/2018 Document Reviewed: 11/15/2018 [employer] Patient Education  2020 [employer]  

## 2020-05-14 NOTE — Assessment & Plan Note (Signed)
Signed 05/14/2020 with patient.

## 2020-05-14 NOTE — Progress Notes (Signed)
BP 134/87   Pulse 63   Temp 98.5 F (36.9 C) (Oral)   Ht 5' 5.5" (1.664 m)   Wt 151 lb (68.5 kg)   SpO2 97%   BMI 24.75 kg/m    Subjective:    Patient ID: Lori May, female    DOB: Oct 05, 1961, 59 y.o.   MRN: [id number]  HPI: Lori May is a 59 y.o. female presenting on 05/14/2020 for comprehensive medical examination. Current medical complaints include:none  She currently lives with: husband Menopausal Symptoms: no   HYPERTENSION Currently taking HCTZ and Lisinopril daily. Has not had labs in 2 years.   Hypertension status: stable  Satisfied with current treatment? yes Duration of hypertension: chronic BP monitoring frequency:  rarely BP range: 120/80 at home, sometimes higher BP medication side effects:  no Medication compliance: good compliance Aspirin: no Recurrent headaches: no Visual changes: no Palpitations: no Dyspnea: no Chest pain: no Lower extremity edema: no Dizzy/lightheaded: no  ANXIETY/STRESS Lost a grandson 2 years ago at young age to brain tumor, that is when she was placed on Xanax and Buspar.  She reports trying different medications -- Zoloft and some others in past.  Pt made aware of risks of benzo medication use to include increased sedation, respiratory suppression, falls, dependence and cardiovascular events.  Pt would like to continue treatment as benefit determined to outweigh risk.    Has taken Ambien and Trazodone for insomnia, taken for > 10 years.  Discussed at length risks of long term Ambien use and that when age 26 will need to lower dose to 5 MG.  Last Ambien fill on PMP review was 03/25/20 (#90 tablets) and Xanax 04/30/20 (#120 tablets). Duration:stable Anxious mood: no  Excessive worrying: yes Irritability: no  Sweating: no Nausea: no Palpitations:no Hyperventilation: no Panic attacks: no Agoraphobia: no  Obscessions/compulsions: no Depressed mood: yes Depression screen [facility] 2/9 05/14/2020 04/16/2020  Decreased Interest 1 0    Down, Depressed, Hopeless 1 1  PHQ - 2 Score 2 1  Altered sleeping 0 3  Tired, decreased energy 0 1  Change in appetite 0 0  Feeling bad or failure about yourself  0 0  Trouble concentrating 0 0  Moving slowly or fidgety/restless 0 0  Suicidal thoughts 0 0  PHQ-9 Score 2 5  Difficult doing work/chores Not difficult at all Not difficult at all   GAD 7 : Generalized Anxiety Score 05/14/2020 04/16/2020  Nervous, Anxious, on Edge 1 1  Control/stop worrying 1 3  Worry too much - different things 1 2  Trouble relaxing 1 0  Restless 0 0  Easily annoyed or irritable 1 1  Afraid - awful might happen 2 3  Total GAD 7 Score 7 10  Anxiety Difficulty Not difficult at all Not difficult at all     The patient does not have a history of falls. I did not complete a risk assessment for falls. A plan of care for falls was not documented.   Past Medical History:  Past Medical History:  Diagnosis Date  . Anemia    H/O  . 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 Cancer ([place]) 2014   right breast LCIS  . Hypertension   . Postmenopausal 2013    Surgical History:  Past Surgical History:  Procedure Laterality Date  . BREAST BIOPSY Left 10/06/2015   ADH  . BREAST EXCISIONAL BIOPSY Left 01/2016   lumpectomy to remove ADH  . BREAST EXCISIONAL BIOPSY Right  10/26/2013   lumpectomy breast lobular carcinoma   . BREAST LUMPECTOMY Right 2014   LCIS  . BREAST LUMPECTOMY WITH NEEDLE LOCALIZATION Left 01/07/2016   Procedure: BREAST LUMPECTOMY WITH NEEDLE LOCALIZATION;  Surgeon: Hubbard Robinson, MD;  Location: [facility] ORS;  Service: General;  Laterality: Left;  . BREAST SURGERY Right 10/26/2013   Partial Mastectomy  . [place]  2007    Medications:  Current Outpatient Medications on File Prior to Visit  Medication Sig  . acetaminophen (TYLENOL) 500 MG tablet Take 500 mg by mouth as needed.   No current facility-administered medications on file prior to visit.     Allergies:  No Known Allergies  Social History:  Social History   Socioeconomic History  . Marital status: Married    Spouse name: Not on file  . Number of children: Not on file  . Years of education: Not on file  . Highest education level: Not on file  Occupational History  . Not on file  Tobacco Use  . Smoking status: Former Smoker    Packs/day: 0.50    Years: 0.00    Pack years: 0.00    Types: Cigarettes    Quit date: 06/09/2016    Years since quitting: 3.9  . Smokeless tobacco: Never Used  . Tobacco comment: ocassional smoker -- social smoker  Substance and Sexual Activity  . Alcohol use: Yes    Alcohol/week: 0.0 standard drinks    Comment: wine OCC  . Drug use: No  . Sexual activity: Yes  Other Topics Concern  . Not on file  Social History Narrative  . Not on file   Social Determinants of Health   Financial Resource Strain:   . Difficulty of Paying Living Expenses:   Food Insecurity:   . Worried About Charity fundraiser in the Last Year:   . Arboriculturist in the Last Year:   Transportation Needs: No Transportation Needs  . Lack of Transportation (Medical): No  . Lack of Transportation (Non-Medical): No  Physical Activity: Sufficiently Active  . Days of Exercise per Week: 6 days  . Minutes of Exercise per Session: 30 min  Stress:   . Feeling of Stress :   Social Connections:   . Frequency of Communication with Friends and Family:   . Frequency of Social Gatherings with Friends and Family:   . Attends Religious Services:   . Active Member of Clubs or Organizations:   . Attends Archivist Meetings:   Marland Kitchen Marital Status:   Intimate Partner Violence:   . Fear of Current or Ex-Partner:   . Emotionally Abused:   Marland Kitchen Physically Abused:   . Sexually Abused:    Social History   Tobacco Use  Smoking Status Former Smoker  . Packs/day: 0.50  . Years: 0.00  . Pack years: 0.00  . Types: Cigarettes  . Quit date: 06/09/2016  . Years since  quitting: 3.9  Smokeless Tobacco Never Used  Tobacco Comment   ocassional smoker -- social smoker   Social History   Substance and Sexual Activity  Alcohol Use Yes  . Alcohol/week: 0.0 standard drinks   Comment: wine OCC    Family History:  Family History  Problem Relation Age of Onset  . Leukemia Mother 60  . Heart disease Father   . Hypertension Sister   . Hypertension Brother   . Hypertension Brother     Past medical history, surgical history, medications, allergies, family history and social history reviewed  with patient today and changes made to appropriate areas of the chart.   Review of Systems - negative All other ROS negative except what is listed above and in the HPI.      Objective:    BP 134/87   Pulse 63   Temp 98.5 F (36.9 C) (Oral)   Ht 5' 5.5" (1.664 m)   Wt 151 lb (68.5 kg)   SpO2 97%   BMI 24.75 kg/m   Wt Readings from Last 3 Encounters:  05/14/20 151 lb (68.5 kg)  04/16/20 153 lb 6.4 oz (69.6 kg)  10/20/16 141 lb (64 kg)    Physical Exam Constitutional:      General: She is awake. She is not in acute distress.    Appearance: She is well-developed. She is not ill-appearing.  HENT:     Head: Normocephalic and atraumatic.     Right Ear: Hearing, tympanic membrane, ear canal and external ear normal. No drainage.     Left Ear: Hearing, tympanic membrane, ear canal and external ear normal. No drainage.     Nose: Nose normal.     Right Sinus: No maxillary sinus tenderness or frontal sinus tenderness.     Left Sinus: No maxillary sinus tenderness or frontal sinus tenderness.     Mouth/Throat:     Mouth: Mucous membranes are moist.     Pharynx: Oropharynx is clear. Uvula midline. No pharyngeal swelling, oropharyngeal exudate or posterior oropharyngeal erythema.  Eyes:     General: Lids are normal.        Right eye: No discharge.        Left eye: No discharge.     Extraocular Movements: Extraocular movements intact.     Conjunctiva/sclera:  Conjunctivae normal.     Pupils: Pupils are equal, round, and reactive to light.     Visual Fields: Right eye visual fields normal and left eye visual fields normal.  Neck:     Thyroid: No thyromegaly.     Vascular: No carotid bruit.     Trachea: Trachea normal.  Cardiovascular:     Rate and Rhythm: Normal rate and regular rhythm.     Heart sounds: Normal heart sounds. No murmur. No gallop.   Pulmonary:     Effort: Pulmonary effort is normal. No accessory muscle usage or respiratory distress.     Breath sounds: Normal breath sounds.  Chest:     Breasts:        Right: Normal.        Left: Normal.     Comments: Old scar line to left breast. Abdominal:     General: Bowel sounds are normal.     Palpations: Abdomen is soft. There is no hepatomegaly or splenomegaly.     Tenderness: There is no abdominal tenderness.  Musculoskeletal:        General: Normal range of motion.     Cervical back: Normal range of motion and neck supple.     Right lower leg: No edema.     Left lower leg: No edema.  Lymphadenopathy:     Head:     Right side of head: No submental, submandibular, tonsillar, preauricular or posterior auricular adenopathy.     Left side of head: No submental, submandibular, tonsillar, preauricular or posterior auricular adenopathy.     Cervical: No cervical adenopathy.     Upper Body:     Right upper body: No supraclavicular, axillary or pectoral adenopathy.     Left upper body: No supraclavicular,  axillary or pectoral adenopathy.  Skin:    General: Skin is warm and dry.     Capillary Refill: Capillary refill takes less than 2 seconds.     Findings: No rash.  Neurological:     Mental Status: She is alert and oriented to person, place, and time.     Cranial Nerves: Cranial nerves are intact.     Gait: Gait is intact.     Deep Tendon Reflexes: Reflexes are normal and symmetric.     Reflex Scores:      Brachioradialis reflexes are 2+ on the right side and 2+ on the left  side.      Patellar reflexes are 2+ on the right side and 2+ on the left side. Psychiatric:        Attention and Perception: Attention normal.        Mood and Affect: Mood normal.        Speech: Speech normal.        Behavior: Behavior normal. Behavior is cooperative.        Thought Content: Thought content normal.        Judgment: Judgment normal.     Results for orders placed or performed during the hospital encounter of 01/07/16  Surgical pathology  Result Value Ref Range   SURGICAL PATHOLOGY      Surgical Pathology CASE: [id number] PATIENT: Vania Olafson Surgical Pathology Report     SPECIMEN SUBMITTED: A. Breast, left, lumpectomy B. Breast, left, medial deep margin C. 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 BREAST, LEFT; MAMMOGRAPHICALLY-DIRECTED EXCISION WITH WIRE LOCALIZATION: - FOCAL ATYPICAL DUCTAL HYPERPLASIA (ADH). - FOCAL FIBROSIS CONSISTENT WITH BIOPSY SITE CHANGES. - MARGINS ARE FREE OF ADH.  B. BREAST, LEFT, MEDIAL DEEP MARGIN; EXCISION: - APOCRINE CYSTS AND FOCAL FIBROADENOMATOUS CHANGES. - NEGATIVE FOR ATYPIA AND MALIGNANCY.  C. BREAST, LEFT, LATERAL INFERIOR MARGIN: - BIOPSY SITE CHANGES AND MARKER CLIP PRESENT. - NEGATIVE FOR ATYPIA AND MALIGNANCY.   GROSS DESCRIPTION: A. Intraoperative Consultation:     Received: Fresh     Speci men: Left breast lumpectomy     Pathologic Evaluation: Gross evaluation of margins     Diagnosis: Left breast, lumpectomy:     - No gross lesions.     Communicated to: Dr. Azalee Course at 11:50 AM on 01/07/2016 by Dr. Dicie Beam     Tissue submitted: For permanent section  Labeled: Left breast lumpectomy Accompanying specimen radiograph: Yes Radiographic findings: Wire present, marker clip absent Time in fixative: 11:50 AM  on 01/07/16 Cold Ischemic Time: 21 minutes Total fixation time: 8 hours Type of procedure: Needle loc Location/laterality of specimen: Left  Orientation of specimen: Short stitch superficial, single long stitch lateral, double long stitch medial  Inking: Caudal = blue Cranial = green Medial = yellow Lateral = orange Posterior = black Anterior/Superficial = red Note: Deviation from normal cranial and caudal inking pattern Size of specimen: Cranial-caudal 2.7 cm, medial-lateral 5.6 cm, superficial-deep 1.7 cm Skin: None present Biopsy site: Not identif ied Number of discrete masses: None Description of remainder of tissue: Breast tissue is predominantly fatty with a small amount of firmness and granularity noted at the cranial edge adjacent to the tip of the localization needle. There is also a small amount of pale white firmness noted at the lateral margin. Sections are taken starting at the  medial margin resection proceeding to the lateral margin of resection  Block summary: Entirely submitted in 13 cassettes (blocks 7 through 9 representative granular area at cranial margin, and block 13 represents discolored area at lateral margin)  B. Intraoperative Consultation:     Received: Fresh     Specimen: Medial deep margin left breast     Pathologic Evaluation: Gross evaluation of margins     Diagnosis: Left breast deep margin:     - No gross lesions     Communicated to: Dr. Azalee Course at 11:50 AM on 01/07/2016 by Dr. Dicie Beam     Tissue submitted: for permanent section  Labeled: Medial deep margin left breast Accompany ing specimen radiograph: Yes Radiographic findings: None Time in fixative: 11:50 AM on 01/07/16 Cold Ischemic Time: 21 minutes Total fixation time: 8 hours Type of procedure: Excision Location/laterality of specimen: Left Orientation of specimen: Short stitch superficial, long stitch lateral  Inking: Cranial = blue Caudal = green Medial = yellow Lateral =  orange Posterior = black Anterior/Superficial = red  Size of specimen: Cranial-caudal 3 cm, medial-lateral 3.1 cm, superficial-deep 0.7 cm Skin: None Biopsy site: Not identified Number of discrete masses: None Description of remainder of tissue: Serial sectioning through the tissue reveals a predominantly fatty cut surface with no masses or lesions grossly noted. Sections are taken starting at the cranial margin of resection proceeding toward the caudal.  Block summary: Entirely submitted in 7 cassettes  C. Intraoperative Consultation:     Received: Fresh     Specimen: Lateral inferior margin left breast      Pathologic Evaluation: Gross evaluation of margins     Diagnosis: Left breast lateral inferior margin:     - Clip identified.     - No associated lesion on gross exam.     Communicated to: Dr. Azalee Course at 12:05 PM 01/07/16 by Dr. Dicie Beam     Tissue submitted: Permanent section  Labeled: Lateral inferior margin left breast Accompanying specimen radiograph: Yes Radiographic findings: Biopsy site marker clip Time in fixative: 12:05 PM on 01/07/16 Cold Ischemic Time: 27 minutes Total fixation time: 7 1/2 hours Type of procedure: Excision Location/laterality of specimen: Left Orientation of specimen: None Inking:  Area around metallic marker inked yellow, rest of specimen inked blue Size of specimen: 3.3 x 2.8 x 0.7 cm Skin: None Biopsy site: Marker clip identified Number of discrete masses: None  Description of remainder of tissue: Sectioning through the area around the marker reveals a small amount of hemorrhagic changes with no discrete mass grossly noted. The rest  of the tissue is predominantly fatty. Sectioned starting at end with marker proceeding away from the marker.  Block summary: Entirely submitted in 5 cassettes  Final Diagnosis performed by Bryan Lemma, MD.  Electronically signed 01/08/2016 7:07:24PM    The electronic signature indicates that the named  Attending Pathologist has evaluated the specimen  Technical component performed at [facility], [address] [place], [place] [address] Lab: [phone] Dir: Darrick Penna. Evette Doffing, MD  Professional component performed at [facility], [facility], [place], [place], [place] [address] Lab: [phone] Dir: Dellia Nims. Reuel Derby, MD        Assessment & Plan:   Problem List Items Addressed This Visit      Cardiovascular and Mediastinum   Essential hypertension    Chronic, stable with BP at goal today and on home readings.  Continue current medication regimen and adjust as needed.  Recommend she continue to monitor BP at home,  monitoring a few days a week and documenting for provider.  DASH diet focus at home.  Labs today TSH, CMP, and lipid panel.  Refills sent in.  Return to office in 3 months.      Relevant Medications   hydrochlorothiazide (HYDRODIURIL) 25 MG tablet   lisinopril (ZESTRIL) 10 MG tablet   Other Relevant Orders   CBC with Differential/Platelet   Comprehensive metabolic panel   Lipid Panel w/o Chol/HDL Ratio   TSH     Other   History of left breast cancer    Diagnostic mammogram ordered.      Relevant Orders   MM Digital Diagnostic Bilat   Generalized anxiety disorder    Chronic, ongoing since loss of grandson 2 years ago.  Denies SI/HI.  Had at length discussion on risks of long term benzo use, she would like to continue treatment at this time. Continue current medication regimen and adjust as needed, Buspar and Xanax.  UDS and controlled substance contract done today, then 3 month visits for refills.  Would benefit from reduction of benzo in future and trial of alternate maintenance medication or increase in Buspar, she is interested in this in future.  Will start out with therapy, would benefit from CBT.  Referral placed after discussion with patient, she is interested in this.  Refills sent and dated for July as recent fills should last until July.   Return in 3 months.      Relevant Medications   ALPRAZolam (XANAX) 0.25 MG tablet (Start on 06/30/2020)   traZODone (DESYREL) 100 MG tablet   busPIRone (BUSPAR) 10 MG tablet   Other Relevant Orders   [id number]+Oxyco+Alc+Crt-Bund   Ambulatory referral to Psychology   Insomnia    Chronic, ongoing for several years.  Continue current medication regimen to include Ambien and Trazodone, discussed at length risks of long term Ambien use and that with age will need to reduce dose.  UDS and controlled substance contract done today + 3 month visits for refills.  Recommend continued focus on healthy sleep hygiene techniques. Refill sent and dated for July, as current fill should last until then.  Return in 3 months.      Controlled substance agreement signed    Signed 05/14/2020 with patient.       Other Visit Diagnoses    Encounter for annual physical exam    -  Primary   Colon cancer screening       Cologuard ordered   Relevant Orders   Cologuard   Encounter for screening mammogram for malignant neoplasm of breast       Mammogram ordered   Relevant Orders   MM Digital Diagnostic Bilat       Follow up plan: Return in about 3 months (around 08/14/2020) for Mood and insomnia.   LABORATORY TESTING:  - Pap smear: up to date  IMMUNIZATIONS:   - Tdap: Tetanus vaccination status reviewed: last tetanus booster within 10 years. - Influenza: Up to date - Pneumovax: Not applicable - Prevnar: Not applicable - HPV: Not applicable - Zostavax vaccine: Refused  SCREENING: -Mammogram: Ordered today  - Colonoscopy: Cologuard ordered  - Bone Density: Not applicable  -Hearing Test: Not applicable  -Spirometry: Not applicable   PATIENT COUNSELING:   Advised to take 1 mg of folate supplement per day if capable of pregnancy.   Sexuality: Discussed sexually transmitted diseases, partner selection, use of condoms, avoidance of unintended pregnancy  and contraceptive alternatives.   Advised to  avoid cigarette  smoking.  I discussed with the patient that most people either abstain from alcohol or drink within safe limits (<=14/week and <=4 drinks/occasion for males, <=7/weeks and <= 3 drinks/occasion for females) and that the risk for alcohol disorders and other health effects rises proportionally with the number of drinks per week and how often a drinker exceeds daily limits.  Discussed cessation/primary prevention of drug use and availability of treatment for abuse.   Diet: Encouraged to adjust caloric intake to maintain  or achieve ideal body weight, to reduce intake of dietary saturated fat and total fat, to limit sodium intake by avoiding high sodium foods and not adding table salt, and to maintain adequate dietary potassium and calcium preferably from fresh fruits, vegetables, and low-fat dairy products.    stressed the importance of regular exercise  Injury prevention: Discussed safety belts, safety helmets, smoke detector, smoking near bedding or upholstery.   Dental health: Discussed importance of regular tooth brushing, flossing, and dental visits.    NEXT PREVENTATIVE PHYSICAL DUE IN 1 YEAR. Return in about 3 months (around 08/14/2020) for Mood and insomnia.

## 2020-05-14 NOTE — Assessment & Plan Note (Signed)
Diagnostic mammogram ordered.  

## 2020-05-14 NOTE — Assessment & Plan Note (Signed)
Chronic, stable with BP at goal today and on home readings.  Continue current medication regimen and adjust as needed.  Recommend she continue to monitor BP at home, monitoring a few days a week and documenting for provider.  DASH diet focus at home.  Labs today TSH, CMP, and lipid panel.  Refills sent in.  Return to office in 3 months.

## 2020-05-14 NOTE — Assessment & Plan Note (Signed)
Chronic, ongoing since loss of grandson 2 years ago.  Denies SI/HI.  Had at length discussion on risks of long term benzo use, she would like to continue treatment at this time. Continue current medication regimen and adjust as needed, Buspar and Xanax.  UDS and controlled substance contract done today, then 3 month visits for refills.  Would benefit from reduction of benzo in future and trial of alternate maintenance medication or increase in Buspar, she is interested in this in future.  Will start out with therapy, would benefit from CBT.  Referral placed after discussion with patient, she is interested in this.  Refills sent and dated for July as recent fills should last until July.  Return in 3 months.

## 2020-05-14 NOTE — Assessment & Plan Note (Signed)
Chronic, ongoing for several years.  Continue current medication regimen to include Ambien and Trazodone, discussed at length risks of long term Ambien use and that with age will need to reduce dose.  UDS and controlled substance contract done today + 3 month visits for refills.  Recommend continued focus on healthy sleep hygiene techniques. Refill sent and dated for July, as current fill should last until then.  Return in 3 months.

## 2020-05-15 LAB — CBC WITH DIFFERENTIAL/PLATELET
Basophils Absolute: 0 10*3/uL (ref 0.0–0.2)
Basos: 1 %
EOS (ABSOLUTE): 0.1 10*3/uL (ref 0.0–0.4)
Eos: 1 %
Hematocrit: 42.6 % (ref 34.0–46.6)
Hemoglobin: 15.1 g/dL (ref 11.1–15.9)
Immature Grans (Abs): 0 10*3/uL (ref 0.0–0.1)
Immature Granulocytes: 0 %
Lymphocytes Absolute: 1.8 10*3/uL (ref 0.7–3.1)
Lymphs: 26 %
MCH: 33.7 pg — ABNORMAL HIGH (ref 26.6–33.0)
MCHC: 35.4 g/dL (ref 31.5–35.7)
MCV: 95 fL (ref 79–97)
Monocytes Absolute: 0.6 10*3/uL (ref 0.1–0.9)
Monocytes: 9 %
Neutrophils Absolute: 4.4 10*3/uL (ref 1.4–7.0)
Neutrophils: 63 %
Platelets: 254 10*3/uL (ref 150–450)
RBC: 4.48 x10E6/uL (ref 3.77–5.28)
RDW: 11.9 % (ref 11.7–15.4)
WBC: 6.9 10*3/uL (ref 3.4–10.8)

## 2020-05-15 LAB — LIPID PANEL W/O CHOL/HDL RATIO
Cholesterol, Total: 239 mg/dL — ABNORMAL HIGH (ref 100–199)
HDL: 68 mg/dL (ref >39)
LDL Chol Calc (NIH): 130 mg/dL — ABNORMAL HIGH (ref 0–99)
Triglycerides: 233 mg/dL — ABNORMAL HIGH (ref 0–149)
VLDL Cholesterol Cal: 41 mg/dL — ABNORMAL HIGH (ref 5–40)

## 2020-05-15 LAB — COMPREHENSIVE METABOLIC PANEL
ALT: 9 IU/L (ref 0–32)
AST: 19 IU/L (ref 0–40)
Albumin/Globulin Ratio: 1.9 (ref 1.2–2.2)
Albumin: 4.8 g/dL (ref 3.8–4.9)
Alkaline Phosphatase: 78 IU/L (ref 48–121)
BUN/Creatinine Ratio: 17 (ref 12–28)
BUN: 13 mg/dL (ref 8–27)
Bilirubin Total: 0.4 mg/dL (ref 0.0–1.2)
CO2: 24 mmol/L (ref 20–29)
Calcium: 10.3 mg/dL (ref 8.7–10.3)
Chloride: 96 mmol/L (ref 96–106)
Creatinine, Ser: 0.76 mg/dL (ref 0.57–1.00)
GFR calc Af Amer: 99 mL/min/{1.73_m2} (ref >59)
GFR calc non Af Amer: 86 mL/min/{1.73_m2} (ref >59)
Globulin, Total: 2.5 g/dL (ref 1.5–4.5)
Glucose: 87 mg/dL (ref 65–99)
Potassium: 3.9 mmol/L (ref 3.5–5.2)
Sodium: 136 mmol/L (ref 134–144)
Total Protein: 7.3 g/dL (ref 6.0–8.5)

## 2020-05-15 LAB — TSH: TSH: 2.34 u[IU]/mL (ref 0.450–4.500)

## 2020-05-15 NOTE — Progress Notes (Signed)
Contacted via MyChart Good morning Lori May.  Your labs have returned and overall look great with exception of cholesterol levels.  Your cholesterol is high, but recommendations at this time to make lifestyle changes. Your LDL is above normal. The LDL is the bad cholesterol. Over time and in combination with inflammation and other factors, this contributes to plaque which in turn may lead to stroke and/or heart attack down the road. Sometimes high LDL is primarily genetic, and people might be eating all the right foods but still have high numbers. Other times, there is room for improvement in one's diet and eating healthier can bring this number down and potentially reduce one's risk of heart attack and/or stroke.   To reduce your LDL, Remember - more fruits and vegetables, more fish, and limit red meat and dairy products. More soy, nuts, beans, barley, lentils, oats and plant sterol ester enriched margarine instead of butter. I also encourage eliminating sugar and processed food. Remember, shop on the outside of the grocery store and visit your [employer]. If you would like to talk with me about dietary changes plus or minus medications for your cholesterol, please let me know. We should recheck your cholesterol in 6 months.

## 2020-05-19 ENCOUNTER — Other Ambulatory Visit: Payer: Self-pay | Admitting: Nurse Practitioner

## 2020-05-19 DIAGNOSIS — Z1231 Encounter for screening mammogram for malignant neoplasm of breast: Secondary | ICD-10-CM

## 2020-05-20 ENCOUNTER — Ambulatory Visit
Admission: RE | Admit: 2020-05-20 | Discharge: 2020-05-20 | Disposition: A | Discharge status: Home / Self Care | Payer: 59 | Source: Ambulatory Visit | Attending: Nurse Practitioner | Admitting: Nurse Practitioner

## 2020-05-20 DIAGNOSIS — Z1231 Encounter for screening mammogram for malignant neoplasm of breast: Secondary | ICD-10-CM | POA: Insufficient documentation

## 2020-05-20 LAB — DRUG SCREEN 764883 11+OXYCO+ALC+CRT-BUND
Amphetamines, Urine: NEGATIVE ng/mL
Barbiturate: NEGATIVE ng/mL
Cannabinoid Quant, Ur: NEGATIVE ng/mL
Cocaine (Metabolite): NEGATIVE ng/mL
Creatinine: 67.3 mg/dL (ref 20.0–300.0)
Ethanol: NEGATIVE %
Meperidine: NEGATIVE ng/mL
Methadone Screen, Urine: NEGATIVE ng/mL
OPIATE SCREEN URINE: NEGATIVE ng/mL
Oxycodone/Oxymorphone, Urine: NEGATIVE ng/mL
Phencyclidine: NEGATIVE ng/mL
Propoxyphene: NEGATIVE ng/mL
Tramadol: NEGATIVE ng/mL
pH, Urine: 5.9 (ref 4.5–8.9)

## 2020-05-20 LAB — BENZODIAZEPINES CONFIRM, URINE: Benzodiazepines: NEGATIVE ng/mL

## 2020-05-22 NOTE — Progress Notes (Signed)
Contacted via MyChart

## 2020-05-27 ENCOUNTER — Telehealth: Payer: Self-pay | Admitting: Nurse Practitioner

## 2020-05-27 NOTE — Telephone Encounter (Signed)
Completed and placed on rack.  Patient can pick up.

## 2020-05-27 NOTE — Telephone Encounter (Signed)
In provider's folder for review.

## 2020-05-27 NOTE — Telephone Encounter (Signed)
Pt is here with paper work from her physical  that she would like signed and sent to [facility]. Please advise had physical on 6/9/202.Form placed in bin to be reviewed.

## 2020-05-27 NOTE — Telephone Encounter (Signed)
Pt aware paperwork ready, and will p/u today.

## 2020-05-29 LAB — EXTERNAL GENERIC LAB PROCEDURE: COLOGUARD: NEGATIVE

## 2020-05-29 LAB — COLOGUARD: Cologuard: NEGATIVE

## 2020-08-20 ENCOUNTER — Ambulatory Visit: Payer: 59 | Admitting: Family Medicine

## 2020-08-20 ENCOUNTER — Other Ambulatory Visit: Payer: Self-pay

## 2020-08-20 ENCOUNTER — Encounter: Payer: Self-pay | Admitting: Family Medicine

## 2020-08-20 DIAGNOSIS — F5104 Psychophysiologic insomnia: Secondary | ICD-10-CM | POA: Diagnosis not present

## 2020-08-20 DIAGNOSIS — F411 Generalized anxiety disorder: Secondary | ICD-10-CM

## 2020-08-20 NOTE — Progress Notes (Signed)
BP 104/68 (BP Location: Right Arm, Patient Position: Sitting)   Pulse 70   Temp 98 F (36.7 C) (Oral)   Ht 5' 5.5" (1.664 m)   Wt 149 lb (67.6 kg)   SpO2 96%   BMI 24.42 kg/m    Subjective:    Patient ID: Lori May, female    DOB: June 06, 1961, 59 y.o.   MRN: [id number]  HPI: Lori KATICH is a 59 y.o. female  Chief Complaint  Patient presents with  . Insomnia    follow up   . Mood   INSOMNIA Duration: chronic Satisfied with sleep quality: yes Difficulty falling asleep: no Difficulty staying asleep: no Waking a few hours after sleep onset: no Early morning awakenings: no Daytime hypersomnolence: no Wakes feeling refreshed: no Good sleep hygiene: no Apnea: no Snoring: no Depressed/anxious mood: yes Recent stress: yes Restless legs/nocturnal leg cramps: no Chronic pain/arthritis: no History of sleep study: no Treatments attempted: melatonin, uinsom, benadryl and ambien   ANXIETY/STRESS- had been on sertaline 50mg  through a mental health provider and it seemed like it was making nauseous and having an upset stomach. She is supposed to follow up in September. Has continued to take her medicine BID. It has been taking the edge off.  Duration: chronic Status:stable Anxious mood: yes  Excessive worrying: yes Irritability: no  Sweating: no Nausea: no Palpitations:no Hyperventilation: no Panic attacks: no Agoraphobia: no  Obscessions/compulsions: no Depressed mood: no Depression screen [facility] 2/9 08/20/2020 05/14/2020 04/16/2020  Decreased Interest 0 1 0  Down, Depressed, Hopeless 0 1 1  PHQ - 2 Score 0 2 1  Altered sleeping 0 0 3  Tired, decreased energy 1 0 1  Change in appetite 0 0 0  Feeling bad or failure about yourself  0 0 0  Trouble concentrating 0 0 0  Moving slowly or fidgety/restless 0 0 0  Suicidal thoughts 0 0 0  PHQ-9 Score 1 2 5   Difficult doing work/chores Not difficult at all Not difficult at all Not difficult at all   Anhedonia: no Weight  changes: no Insomnia: no   Hypersomnia: no Fatigue/loss of energy: no Feelings of worthlessness: no Feelings of guilt: no Impaired concentration/indecisiveness: no Suicidal ideations: no  Crying spells: no Recent Stressors/Life Changes: no   Relationship problems: no   Family stress: no     Financial stress: no    Job stress: no    Recent death/loss: no  Relevant past medical, surgical, family and social history reviewed and updated as indicated. Interim medical history since our last visit reviewed. Allergies and medications reviewed and updated.  Review of Systems  Constitutional: Negative.   Respiratory: Negative.   Cardiovascular: Negative.   Gastrointestinal: Negative.   Psychiatric/Behavioral: Negative.     Per HPI unless specifically indicated above     Objective:    BP 104/68 (BP Location: Right Arm, Patient Position: Sitting)   Pulse 70   Temp 98 F (36.7 C) (Oral)   Ht 5' 5.5" (1.664 m)   Wt 149 lb (67.6 kg)   SpO2 96%   BMI 24.42 kg/m   Wt Readings from Last 3 Encounters:  08/20/20 149 lb (67.6 kg)  05/14/20 151 lb (68.5 kg)  04/16/20 153 lb 6.4 oz (69.6 kg)    Physical Exam Vitals and nursing note reviewed.  Constitutional:      General: She is not in acute distress.    Appearance: Normal appearance. She is not ill-appearing, toxic-appearing or diaphoretic.  HENT:  Head: Normocephalic and atraumatic.     Right Ear: External ear normal.     Left Ear: External ear normal.     Nose: Nose normal.     Mouth/Throat:     Mouth: Mucous membranes are moist.     Pharynx: Oropharynx is clear.  Eyes:     General: No scleral icterus.       Right eye: No discharge.        Left eye: No discharge.     Extraocular Movements: Extraocular movements intact.     Conjunctiva/sclera: Conjunctivae normal.     Pupils: Pupils are equal, round, and reactive to light.  Cardiovascular:     Rate and Rhythm: Normal rate and regular rhythm.     Pulses: Normal  pulses.     Heart sounds: Normal heart sounds. No murmur heard.  No friction rub. No gallop.   Pulmonary:     Effort: Pulmonary effort is normal. No respiratory distress.     Breath sounds: Normal breath sounds. No stridor. No wheezing, rhonchi or rales.  Chest:     Chest wall: No tenderness.  Musculoskeletal:        General: Normal range of motion.     Cervical back: Normal range of motion and neck supple.  Skin:    General: Skin is warm and dry.     Capillary Refill: Capillary refill takes less than 2 seconds.     Coloration: Skin is not jaundiced or pale.     Findings: No bruising, erythema, lesion or rash.  Neurological:     General: No focal deficit present.     Mental Status: She is alert and oriented to person, place, and time. Mental status is at baseline.  Psychiatric:        Mood and Affect: Mood normal.        Behavior: Behavior normal.        Thought Content: Thought content normal.        Judgment: Judgment normal.     Results for orders placed or performed in visit on 05/29/20  Cologuard  Result Value Ref Range   Cologuard Negative Negative      Assessment & Plan:   Problem List Items Addressed This Visit      Other   Generalized anxiety disorder    Encouraged patient to take as needed rather than every 12 hours. Under good control on current regimen. Continue current regimen. Continue to monitor. Call with any concerns. Refills given. Has 2 more refills on her current Rx. Will get her another 2 Rxs to get to follow up.        Relevant Medications   ALPRAZolam (XANAX) 0.25 MG tablet (Start on 11/04/2020)   Insomnia    Under good control on current regimen. Continue current regimen. Continue to monitor. Call with any concerns. Refills given. Has 2 more refills on her current Rx. Will get her another Rx to get to follow up.            Follow up plan: Return in about 3 months (around 11/19/2020).

## 2020-08-22 MED ORDER — ALPRAZOLAM 0.25 MG PO TABS: 0.2500 mg | ORAL_TABLET | Freq: Two times a day (BID) | ORAL | 0 refills | Status: DC

## 2020-08-22 MED ORDER — ZOLPIDEM TARTRATE 10 MG PO TABS: 10.0000 mg | ORAL_TABLET | Freq: Every day | ORAL | 1 refills | Status: DC

## 2020-08-22 NOTE — Assessment & Plan Note (Signed)
Under good control on current regimen. Continue current regimen. Continue to monitor. Call with any concerns. Refills given. Has 2 more refills on her current Rx. Will get her another Rx to get to follow up.

## 2020-08-22 NOTE — Assessment & Plan Note (Addendum)
Encouraged patient to take as needed rather than every 12 hours. Under good control on current regimen. Continue current regimen. Continue to monitor. Call with any concerns. Refills given. Has 2 more refills on her current Rx. Will get her another 2 Rxs to get to follow up.

## 2020-11-15 ENCOUNTER — Encounter: Payer: Self-pay | Admitting: Nurse Practitioner

## 2020-11-15 DIAGNOSIS — E782 Mixed hyperlipidemia: Secondary | ICD-10-CM | POA: Insufficient documentation

## 2020-11-15 DIAGNOSIS — E7800 Pure hypercholesterolemia, unspecified: Secondary | ICD-10-CM | POA: Insufficient documentation

## 2020-11-19 ENCOUNTER — Other Ambulatory Visit: Payer: Self-pay

## 2020-11-19 ENCOUNTER — Encounter: Payer: Self-pay | Admitting: Nurse Practitioner

## 2020-11-19 ENCOUNTER — Ambulatory Visit: Payer: 59 | Admitting: Nurse Practitioner

## 2020-11-19 VITALS — BP 104/68 | HR 58 | Temp 99.5°F | Ht 66.93 in | Wt 149.0 lb

## 2020-11-19 DIAGNOSIS — F411 Generalized anxiety disorder: Secondary | ICD-10-CM

## 2020-11-19 DIAGNOSIS — F5104 Psychophysiologic insomnia: Secondary | ICD-10-CM

## 2020-11-19 DIAGNOSIS — I10 Essential (primary) hypertension: Secondary | ICD-10-CM | POA: Diagnosis not present

## 2020-11-19 MED ORDER — ALPRAZOLAM 0.25 MG PO TABS: 0.2500 mg | ORAL_TABLET | Freq: Two times a day (BID) | ORAL | 0 refills | Status: DC

## 2020-11-19 MED ORDER — ZOLPIDEM TARTRATE 10 MG PO TABS: 10.0000 mg | ORAL_TABLET | Freq: Every day | ORAL | 0 refills | Status: DC

## 2020-11-19 NOTE — Progress Notes (Signed)
BP 104/68   Pulse (!) 58   Temp 99.5 F (37.5 C) (Oral)   Ht 5' 6.93" (1.7 m)   Wt 149 lb (67.6 kg)   BMI 23.39 kg/m    Subjective:    Patient ID: Lori May, female    DOB: October 22, 1961, 59 y.o.   MRN: [id number]  HPI: Lori May is a 59 y.o. female  Chief Complaint  Patient presents with  . Medication check   HYPERTENSION Currently taking HCTZ and Lisinopril daily.  She reports she has been feeling tired lately.  Drinking good fluids at home. Hypertension status:stable Satisfied with current treatment?yes Duration of hypertension:chronic BP monitoring frequency:rarely BP range: BP medication side effects:no Medication compliance:good compliance Aspirin:no Recurrent headaches:no Visual changes:no Palpitations:no Dyspnea:no Chest pain:no Lower extremity edema:no Dizzy/lightheaded:no  ANXIETY/STRESS Medication follow-up today.  Lost a grandson 2 yearsago at young age to brain tumor, that is when she was placed on Xanax and Buspar. She reports trying different medications -- Zoloft and some othersin past. Pt madeaware of risks of benzomedication use to include increased sedation, respiratory suppression, falls, dependence and cardiovascular events. Ptwould like to continue treatment as benefit determined to outweigh risk. On average takes Xanax twice a day.    Has taken Ambien and Trazodone for insomnia, taken for > 10 years.Discussed at length risks of long term Ambien use and that when age 79 will need to lower dose to 5 MG.Last Ambien fill on PDMP review was 10/26/20 (#30 tablets) and Xanax 11/05/20 (#60 tablets).  UDS performed 05/14/2020 and contract signed 05/28/20. Duration:stable Anxious mood:no Excessive worrying:yes Irritability:no Sweating:no Nausea:no Palpitations:no Hyperventilation:no Panic attacks:no Agoraphobia:no Obscessions/compulsions:no Depressed mood:yes Depression screen [facility] 2/9 11/19/2020  08/20/2020 05/14/2020 04/16/2020  Decreased Interest 0 0 1 0  Down, Depressed, Hopeless 0 0 1 1  PHQ - 2 Score 0 0 2 1  Altered sleeping 0 0 0 3  Tired, decreased energy 1 1 0 1  Change in appetite - 0 0 0  Feeling bad or failure about yourself  0 0 0 0  Trouble concentrating 0 0 0 0  Moving slowly or fidgety/restless 0 0 0 0  Suicidal thoughts 0 0 0 0  PHQ-9 Score 1 1 2 5   Difficult doing work/chores Not difficult at all Not difficult at all Not difficult at all Not difficult at all   GAD 7 : Generalized Anxiety Score 11/19/2020 08/20/2020 05/14/2020 04/16/2020  Nervous, Anxious, on Edge 0 1 1 1   Control/stop worrying 0 1 1 3   Worry too much - different things 0 1 1 2   Trouble relaxing 0 0 1 0  Restless 0 0 0 0  Easily annoyed or irritable 0 1 1 1   Afraid - awful might happen 0 1 2 3   Total GAD 7 Score 0 5 7 10   Anxiety Difficulty - Not difficult at all Not difficult at all Not difficult at all    Relevant past medical, surgical, family and social history reviewed and updated as indicated. Interim medical history since our last visit reviewed. Allergies and medications reviewed and updated.  Review of Systems  Constitutional: Negative for activity change, appetite change, diaphoresis, fatigue and fever.  Respiratory: Negative for cough, chest tightness and shortness of breath.   Cardiovascular: Negative for chest pain, palpitations and leg swelling.  Gastrointestinal: Negative.   Neurological: Negative.   Psychiatric/Behavioral: Positive for sleep disturbance. Negative for decreased concentration, self-injury and suicidal ideas. The patient is nervous/anxious.     Per HPI  unless specifically indicated above     Objective:    BP 104/68   Pulse (!) 58   Temp 99.5 F (37.5 C) (Oral)   Ht 5' 6.93" (1.7 m)   Wt 149 lb (67.6 kg)   BMI 23.39 kg/m   Wt Readings from Last 3 Encounters:  11/19/20 149 lb (67.6 kg)  08/20/20 149 lb (67.6 kg)  05/14/20 151 lb (68.5 kg)    Physical  Exam Vitals and nursing note reviewed.  Constitutional:      General: She is awake. She is not in acute distress.    Appearance: She is well-developed, well-groomed and overweight. She is not ill-appearing.  HENT:     Head: Normocephalic.     Right Ear: Hearing normal.     Left Ear: Hearing normal.  Eyes:     General: Lids are normal.        Right eye: No discharge.        Left eye: No discharge.     Conjunctiva/sclera: Conjunctivae normal.     Pupils: Pupils are equal, round, and reactive to light.  Neck:     Thyroid: No thyromegaly.     Vascular: No carotid bruit.  Cardiovascular:     Rate and Rhythm: Normal rate and regular rhythm.     Heart sounds: Normal heart sounds. No murmur heard. No gallop.   Pulmonary:     Effort: Pulmonary effort is normal. No accessory muscle usage or respiratory distress.     Breath sounds: Normal breath sounds.  Abdominal:     General: Bowel sounds are normal.     Palpations: Abdomen is soft.  Musculoskeletal:     Cervical back: Normal range of motion and neck supple.     Right lower leg: No edema.     Left lower leg: No edema.  Lymphadenopathy:     Cervical: No cervical adenopathy.  Skin:    General: Skin is warm and dry.  Neurological:     Mental Status: She is alert and oriented to person, place, and time.  Psychiatric:        Attention and Perception: Attention normal.        Mood and Affect: Mood normal.        Speech: Speech normal.        Behavior: Behavior normal. Behavior is cooperative.        Thought Content: Thought content normal.     Results for orders placed or performed in visit on 05/29/20  Cologuard  Result Value Ref Range   Cologuard Negative Negative      Assessment & Plan:   Problem List Items Addressed This Visit      Cardiovascular and Mediastinum   Essential hypertension - Primary    Chronic, stable with BP below goal today.  Will discontinue HCTZ and maintain Lisinopril at this time for kidney  protection, educated her on this and to alert provider if elevation in BP consistently at home >130/80.  Recommend she continue to monitor BP at home, monitoring a few days a week and documenting for provider.  DASH diet focus at home. Return to office in 3 months.        Other   Generalized anxiety disorder    Chronic, ongoing since loss of grandson 2 years ago.  Denies SI/HI.  Had at length discussion on risks of long term benzo use, she would like to continue treatment at this time. Continue current medication regimen and adjust as needed, Xanax.  UDS and controlled substance contract done up to date.  Would benefit from reduction of benzo in future and trial of alternate maintenance medication, she is interested in this in future.   Refills sent.  Return in 3 months.      Relevant Medications   ALPRAZolam (XANAX) 0.25 MG tablet (Start on 12/04/2020)   Insomnia    Chronic, ongoing for several years.  Continue current medication regimen to include Ambien and Trazodone, discussed at length risks of long term Ambien use and that with age will need to reduce dose.  UDS and controlled substance contract up to date.  Recommend continued focus on healthy sleep hygiene techniques. Refill sent.  Return in 3 months.          Follow up plan: Return in about 3 months (around 02/17/2021) for MOOD and HTN.

## 2020-11-19 NOTE — Patient Instructions (Signed)

## 2020-11-19 NOTE — Assessment & Plan Note (Signed)
Chronic, stable with BP below goal today.  Will discontinue HCTZ and maintain Lisinopril at this time for kidney protection, educated her on this and to alert provider if elevation in BP consistently at home >130/80.  Recommend she continue to monitor BP at home, monitoring a few days a week and documenting for provider.  DASH diet focus at home. Return to office in 3 months.

## 2020-11-19 NOTE — Assessment & Plan Note (Signed)
Chronic, ongoing since loss of grandson 2 years ago.  Denies SI/HI.  Had at length discussion on risks of long term benzo use, she would like to continue treatment at this time. Continue current medication regimen and adjust as needed, Xanax.  UDS and controlled substance contract done up to date.  Would benefit from reduction of benzo in future and trial of alternate maintenance medication, she is interested in this in future.   Refills sent.  Return in 3 months.

## 2020-11-19 NOTE — Assessment & Plan Note (Signed)
Chronic, ongoing for several years.  Continue current medication regimen to include Ambien and Trazodone, discussed at length risks of long term Ambien use and that with age will need to reduce dose.  UDS and controlled substance contract up to date.  Recommend continued focus on healthy sleep hygiene techniques. Refill sent. Return in 3 months. 

## 2021-02-17 ENCOUNTER — Encounter: Payer: Self-pay | Admitting: Nurse Practitioner

## 2021-02-17 ENCOUNTER — Other Ambulatory Visit: Payer: Self-pay

## 2021-02-17 ENCOUNTER — Ambulatory Visit (INDEPENDENT_AMBULATORY_CARE_PROVIDER_SITE_OTHER): Payer: 59 | Admitting: Nurse Practitioner

## 2021-02-17 DIAGNOSIS — I10 Essential (primary) hypertension: Secondary | ICD-10-CM | POA: Diagnosis not present

## 2021-02-17 DIAGNOSIS — F411 Generalized anxiety disorder: Secondary | ICD-10-CM | POA: Diagnosis not present

## 2021-02-17 DIAGNOSIS — F5104 Psychophysiologic insomnia: Secondary | ICD-10-CM | POA: Diagnosis not present

## 2021-02-17 MED ORDER — ALPRAZOLAM 0.25 MG PO TABS: 0.2500 mg | ORAL_TABLET | Freq: Two times a day (BID) | ORAL | 0 refills | Status: DC

## 2021-02-17 MED ORDER — ZOLPIDEM TARTRATE 10 MG PO TABS: 10.0000 mg | ORAL_TABLET | Freq: Every day | ORAL | 0 refills | Status: DC

## 2021-02-17 NOTE — Progress Notes (Signed)
BP 127/83    Pulse 63    Temp 98.3 F (36.8 C) (Oral)    Wt 148 lb 6.4 oz (67.3 kg)    SpO2 97%    BMI 23.29 kg/m    Subjective:    Patient ID: Lori May, female    DOB: 1961-04-23, 60 y.o.   MRN: [id number]  HPI: Lori May is a 60 y.o. female  Chief Complaint  Patient presents with   Hypertension    Patient states she has been checking blood pressure at home with her device and she is not sure if her machine has been giving off accurate readings.   Mood Check   HYPERTENSION Currently taking Lisinopril daily.  Hypertension status:stable Satisfied with current treatment?yes Duration of hypertension:chronic BP monitoring frequency:rarely BP range:130/80 BP medication side effects:no Medication compliance:good compliance Aspirin:no Recurrent headaches:no Visual changes:no Palpitations:no Dyspnea:no Chest pain:no Lower extremity edema:no Dizzy/lightheaded:no  ANXIETY/STRESS Medication follow-up today.  Lost a grandson 2 yearsago at young age to brain tumor, that is when she was placed on Xanax and Buspar. She reports trying different medications -- Zoloft and some othersin past. Pt madeaware of risks of benzomedication use to include increased sedation, respiratory suppression, falls, dependence and cardiovascular events. Ptwould like to continue treatment as benefit determined to outweigh risk. On average in past took twice a day and currently often taking once a day.  Has taken Ambien and Trazodone for insomnia, taken for > 10 years.Discussed at length risks of long term Ambien use and that when age 32 will need to lower dose to 5 MG.Last Ambien fill on PDMP review was 11/24/20 (#30 tablets) and Xanax 11/20/20 (#60 tablets).  UDS performed 05/14/2020 and contract signed 05/28/20.  Duration:stable Anxious mood:no Excessive worrying:yes Irritability:no Sweating:no Nausea:no Palpitations:no Hyperventilation:no Panic  attacks:no Agoraphobia:no Obscessions/compulsions:no Depressed mood:no Depression screen [facility] 2/9 02/17/2021 11/19/2020 08/20/2020 05/14/2020 04/16/2020  Decreased Interest 0 0 0 1 0  Down, Depressed, Hopeless 0 0 0 1 1  PHQ - 2 Score 0 0 0 2 1  Altered sleeping 0 0 0 0 3  Tired, decreased energy 0 1 1 0 1  Change in appetite 0 - 0 0 0  Feeling bad or failure about yourself  0 0 0 0 0  Trouble concentrating 0 0 0 0 0  Moving slowly or fidgety/restless 0 0 0 0 0  Suicidal thoughts 0 0 0 0 0  PHQ-9 Score 0 1 1 2 5   Difficult doing work/chores - Not difficult at all Not difficult at all Not difficult at all Not difficult at all   GAD 7 : Generalized Anxiety Score 02/17/2021 11/19/2020 08/20/2020 05/14/2020  Nervous, Anxious, on Edge 0 0 1 1  Control/stop worrying 0 0 1 1  Worry too much - different things 0 0 1 1  Trouble relaxing 0 0 0 1  Restless 0 0 0 0  Easily annoyed or irritable 0 0 1 1  Afraid - awful might happen 0 0 1 2  Total GAD 7 Score 0 0 5 7  Anxiety Difficulty Not difficult at all - Not difficult at all Not difficult at all    Relevant past medical, surgical, family and social history reviewed and updated as indicated. Interim medical history since our last visit reviewed. Allergies and medications reviewed and updated.  Review of Systems  Constitutional: Negative for activity change, appetite change, diaphoresis, fatigue and fever.  Respiratory: Negative for cough, chest tightness and shortness of breath.   Cardiovascular:  Negative for chest pain, palpitations and leg swelling.  Gastrointestinal: Negative.   Neurological: Negative.   Psychiatric/Behavioral: Positive for sleep disturbance. Negative for decreased concentration, self-injury and suicidal ideas. The patient is nervous/anxious.     Per HPI unless specifically indicated above     Objective:    BP 127/83    Pulse 63    Temp 98.3 F (36.8 C) (Oral)    Wt 148 lb 6.4 oz (67.3 kg)    SpO2 97%    BMI 23.29  kg/m   Wt Readings from Last 3 Encounters:  02/17/21 148 lb 6.4 oz (67.3 kg)  11/19/20 149 lb (67.6 kg)  08/20/20 149 lb (67.6 kg)    Physical Exam Vitals and nursing note reviewed.  Constitutional:      General: She is awake. She is not in acute distress.    Appearance: She is well-developed, well-groomed and overweight. She is not ill-appearing.  HENT:     Head: Normocephalic.     Right Ear: Hearing normal.     Left Ear: Hearing normal.  Eyes:     General: Lids are normal.        Right eye: No discharge.        Left eye: No discharge.     Conjunctiva/sclera: Conjunctivae normal.     Pupils: Pupils are equal, round, and reactive to light.  Neck:     Thyroid: No thyromegaly.     Vascular: No carotid bruit.  Cardiovascular:     Rate and Rhythm: Normal rate and regular rhythm.     Heart sounds: Normal heart sounds. No murmur heard. No gallop.   Pulmonary:     Effort: Pulmonary effort is normal. No accessory muscle usage or respiratory distress.     Breath sounds: Normal breath sounds.  Abdominal:     General: Bowel sounds are normal.     Palpations: Abdomen is soft.  Musculoskeletal:     Cervical back: Normal range of motion and neck supple.     Right lower leg: No edema.     Left lower leg: No edema.  Lymphadenopathy:     Cervical: No cervical adenopathy.  Skin:    General: Skin is warm and dry.  Neurological:     Mental Status: She is alert and oriented to person, place, and time.  Psychiatric:        Attention and Perception: Attention normal.        Mood and Affect: Mood normal.        Speech: Speech normal.        Behavior: Behavior normal. Behavior is cooperative.        Thought Content: Thought content normal.    Results for orders placed or performed in visit on 05/29/20  Cologuard  Result Value Ref Range   Cologuard Negative Negative      Assessment & Plan:   Problem List Items Addressed This Visit      Cardiovascular and Mediastinum   Essential  hypertension    Chronic, stable with BP below goal today.  Will maintain Lisinopril at this time for kidney protection, educated her on this and to alert provider if elevation in BP consistently at home >130/80.  Recommend she continue to monitor BP at home, monitoring a few days a week and documenting for provider.  DASH diet focus at home. Return to office in 3 months for physical.        Other   Generalized anxiety disorder    Chronic,  ongoing since loss of grandson 2 years ago.  Denies SI/HI.  Had at length discussion on risks of long term benzo use, she would like to continue treatment at this time. Continue current medication regimen and adjust as needed, Xanax.  UDS and controlled substance contract up to date.  Is working on self reduction of Xanax, praised for this.   Refills sent.  Return in 3 months.      Relevant Medications   sertraline (ZOLOFT) 50 MG tablet   ALPRAZolam (XANAX) 0.25 MG tablet   Insomnia    Chronic, ongoing for several years.  Continue current medication regimen to include Ambien and Trazodone, discussed at length risks of long term Ambien use and that with age will need to reduce dose.  UDS and controlled substance contract up to date.  Recommend continued focus on healthy sleep hygiene techniques. Refill sent. Return in 3 months.          Follow up plan: Return in about 3 months (around 05/20/2021) for Annual physical.

## 2021-02-17 NOTE — Assessment & Plan Note (Signed)
Chronic, ongoing since loss of grandson 2 years ago.  Denies SI/HI.  Had at length discussion on risks of long term benzo use, she would like to continue treatment at this time. Continue current medication regimen and adjust as needed, Xanax.  UDS and controlled substance contract up to date.  Is working on self reduction of Xanax, praised for this.   Refills sent.  Return in 3 months.

## 2021-02-17 NOTE — Assessment & Plan Note (Signed)
Chronic, stable with BP below goal today.  Will maintain Lisinopril at this time for kidney protection, educated her on this and to alert provider if elevation in BP consistently at home >130/80.  Recommend she continue to monitor BP at home, monitoring a few days a week and documenting for provider.  DASH diet focus at home. Return to office in 3 months for physical.

## 2021-02-17 NOTE — Assessment & Plan Note (Signed)
Chronic, ongoing for several years.  Continue current medication regimen to include Ambien and Trazodone, discussed at length risks of long term Ambien use and that with age will need to reduce dose.  UDS and controlled substance contract up to date.  Recommend continued focus on healthy sleep hygiene techniques. Refill sent. Return in 3 months.

## 2021-02-17 NOTE — Patient Instructions (Signed)
[email or web address]">  Generalized Anxiety Disorder, Adult Generalized anxiety disorder (GAD) is a mental health condition. Unlike normal worries, anxiety related to GAD is not triggered by a specific event. These worries do not fade or get better with time. GAD interferes with relationships, work, and school. GAD symptoms can vary from mild to severe. People with severe GAD can have intense waves of anxiety with physical symptoms that are similar to panic attacks. What are the causes? The exact cause of GAD is not known, but the following are believed to have an impact:  Differences in natural brain chemicals.  Genes passed down from parents to children.  Differences in the way threats are perceived.  Development during childhood.  Personality. What increases the risk? The following factors may make you more likely to develop this condition:  Being female.  Having a family history of anxiety disorders.  Being very shy.  Experiencing very stressful life events, such as the death of a loved one.  Having a very stressful family environment. What are the signs or symptoms? People with GAD often worry excessively about many things in their lives, such as their health and family. Symptoms may also include:  Mental and emotional symptoms: ? Worrying excessively about natural disasters. ? Fear of being late. ? Difficulty concentrating. ? Fears that others are judging your performance.  Physical symptoms: ? Fatigue. ? Headaches, muscle tension, muscle twitches, trembling, or feeling shaky. ? Feeling like your heart is pounding or beating very fast. ? Feeling out of breath or like you cannot take a deep breath. ? Having trouble falling asleep or staying asleep, or experiencing restlessness. ? Sweating. ? Nausea, diarrhea, or irritable bowel syndrome (IBS).  Behavioral symptoms: ? Experiencing erratic moods or irritability. ? Avoidance of new situations. ? Avoidance of  people. ? Extreme difficulty making decisions. How is this diagnosed? This condition is diagnosed based on your symptoms and medical history. You will also have a physical exam. Your health care provider may perform tests to rule out other possible causes of your symptoms. To be diagnosed with GAD, a person must have anxiety that:  Is out of his or her control.  Affects several different aspects of his or her life, such as work and relationships.  Causes distress that makes him or her unable to take part in normal activities.  Includes at least three symptoms of GAD, such as restlessness, fatigue, trouble concentrating, irritability, muscle tension, or sleep problems. Before your health care provider can confirm a diagnosis of GAD, these symptoms must be present more days than they are not, and they must last for 6 months or longer. How is this treated? This condition may be treated with:  Medicine. Antidepressant medicine is usually prescribed for long-term daily control. Anti-anxiety medicines may be added in severe cases, especially when panic attacks occur.  Talk therapy (psychotherapy). Certain types of talk therapy can be helpful in treating GAD by providing support, education, and guidance. Options include: ? Cognitive behavioral therapy (CBT). People learn coping skills and self-calming techniques to ease their physical symptoms. They learn to identify unrealistic thoughts and behaviors and to replace them with more appropriate thoughts and behaviors. ? Acceptance and commitment therapy (ACT). This treatment teaches people how to be mindful as a way to cope with unwanted thoughts and feelings. ? Biofeedback. This process trains you to manage your body's response (physiological response) through breathing techniques and relaxation methods. You will work with a therapist while machines are used to monitor your physical   symptoms.  Stress management techniques. These include yoga,  meditation, and exercise. A mental health specialist can help determine which treatment is best for you. Some people see improvement with one type of therapy. However, other people require a combination of therapies.   Follow these instructions at home: Lifestyle  Maintain a consistent routine and schedule.  Anticipate stressful situations. Create a plan, and allow extra time to work with your plan.  Practice stress management or self-calming techniques that you have learned from your therapist or your health care provider. General instructions  Take over-the-counter and prescription medicines only as told by your health care provider.  Understand that you are likely to have setbacks. Accept this and be kind to yourself as you persist to take better care of yourself.  Recognize and accept your accomplishments, even if you judge them as small.  Keep all follow-up visits as told by your health care provider. This is important. Contact a health care provider if:  Your symptoms do not get better.  Your symptoms get worse.  You have signs of depression, such as: ? A persistently sad or irritable mood. ? Loss of enjoyment in activities that used to bring you joy. ? Change in weight or eating. ? Changes in sleeping habits. ? Avoiding friends or family members. ? Loss of energy for normal tasks. ? Feelings of guilt or worthlessness. Get help right away if:  You have serious thoughts about hurting yourself or others. If you ever feel like you may hurt yourself or others, or have thoughts about taking your own life, get help right away. Go to your nearest emergency department or:  Call your local emergency services (911 in the [place]  Call a suicide crisis helpline, such as the National Suicide Prevention Lifeline at 1-800-273-8255. This is open 24 hours a day in the [place]  Text the Crisis Text Line at 741741 (in the [place] Summary  Generalized anxiety disorder (GAD) is a mental  health condition that involves worry that is not triggered by a specific event.  People with GAD often worry excessively about many things in their lives, such as their health and family.  GAD may cause symptoms such as restlessness, trouble concentrating, sleep problems, frequent sweating, nausea, diarrhea, headaches, and trembling or muscle twitching.  A mental health specialist can help determine which treatment is best for you. Some people see improvement with one type of therapy. However, other people require a combination of therapies. This information is not intended to replace advice given to you by your health care provider. Make sure you discuss any questions you have with your health care provider. Document Revised: 09/12/2019 Document Reviewed: 09/12/2019 Elsevier Patient Education  2021 [employer]  

## 2021-04-30 IMAGING — MG DIGITAL SCREENING BILAT W/ TOMO W/ CAD
8 series · 8 of 24 positions shown · non-contrast
Comparison: Previous exam(s).

CLINICAL DATA: Screening.

EXAM:
DIGITAL SCREENING BILATERAL MAMMOGRAM WITH TOMO AND CAD

[L CC synth-2D]
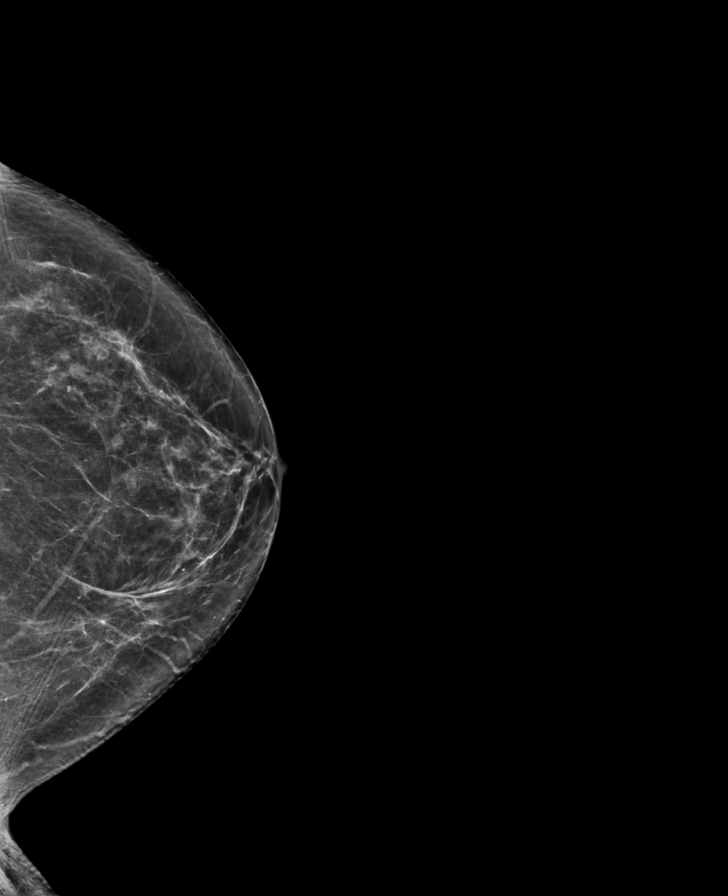

[R MLO synth-2D]
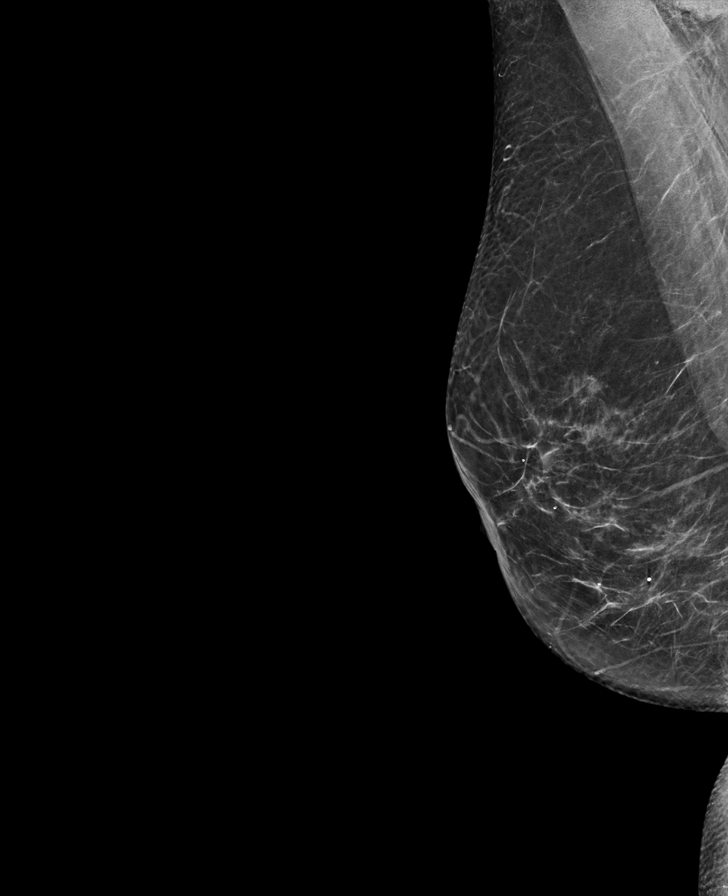

[L MLO synth-2D]
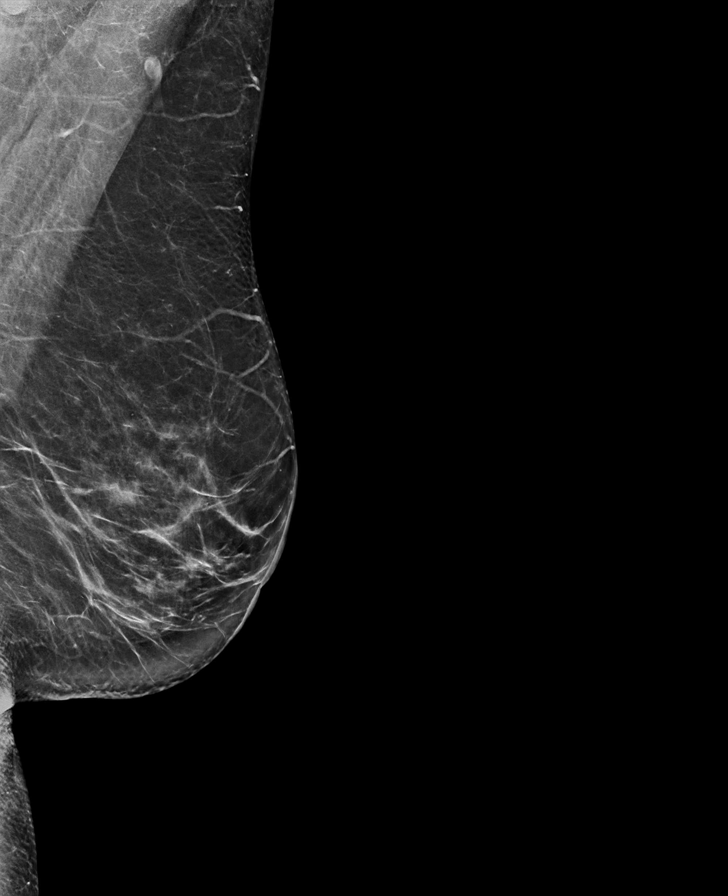

[R CC synth-2D]
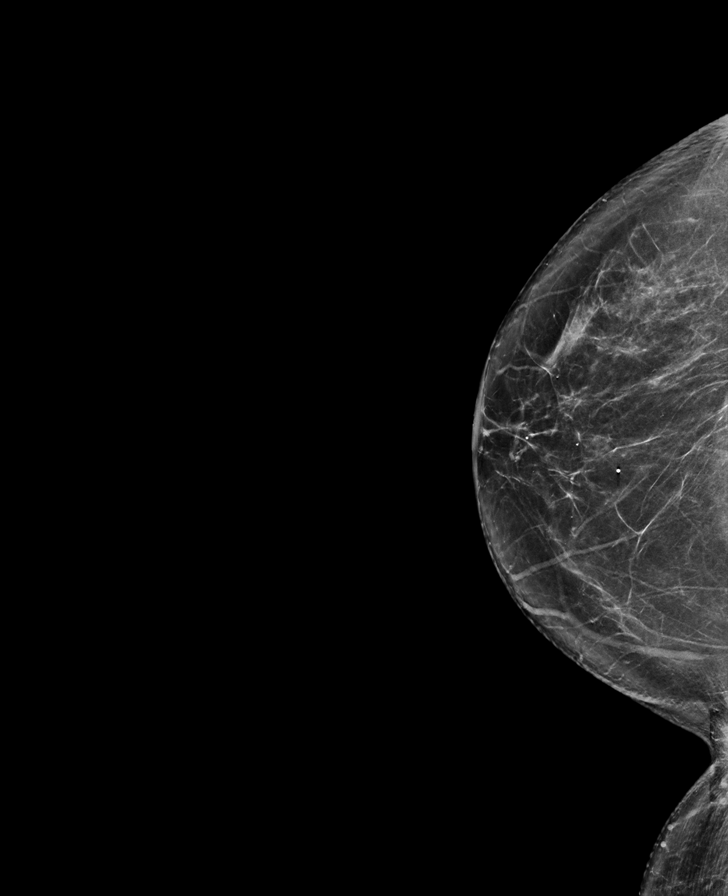

[L CC tomo · tomo slice 36/71.0]
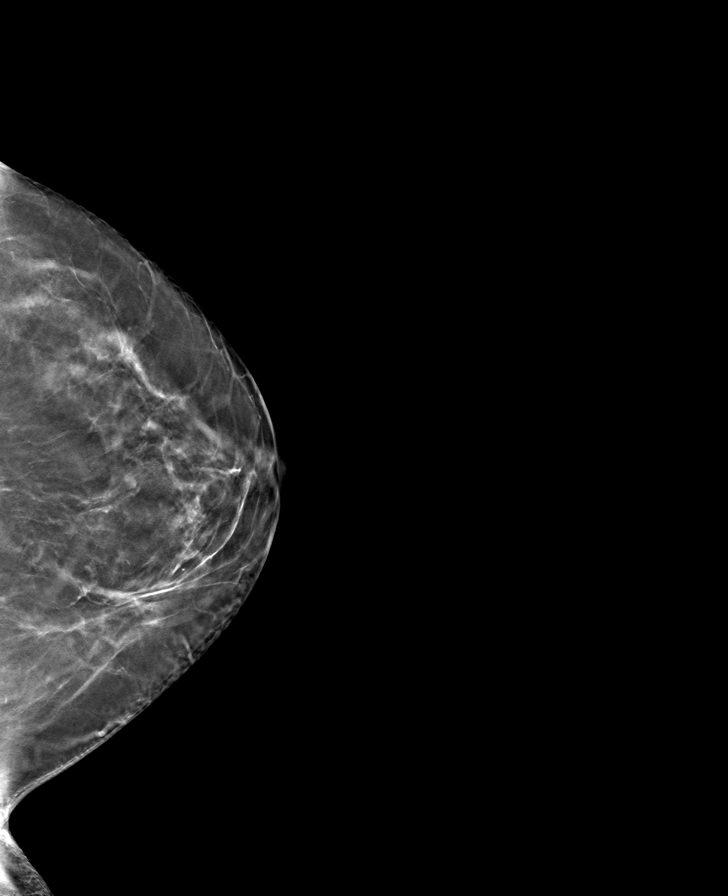

[L MLO tomo · tomo slice 39/78.0]
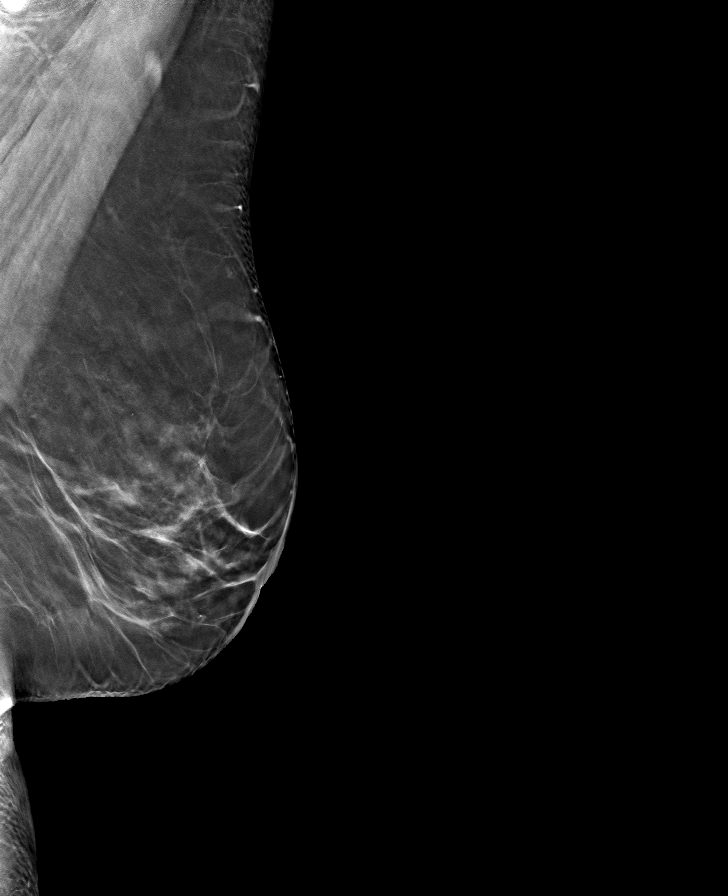

[R CC tomo · tomo slice 39/77.0]
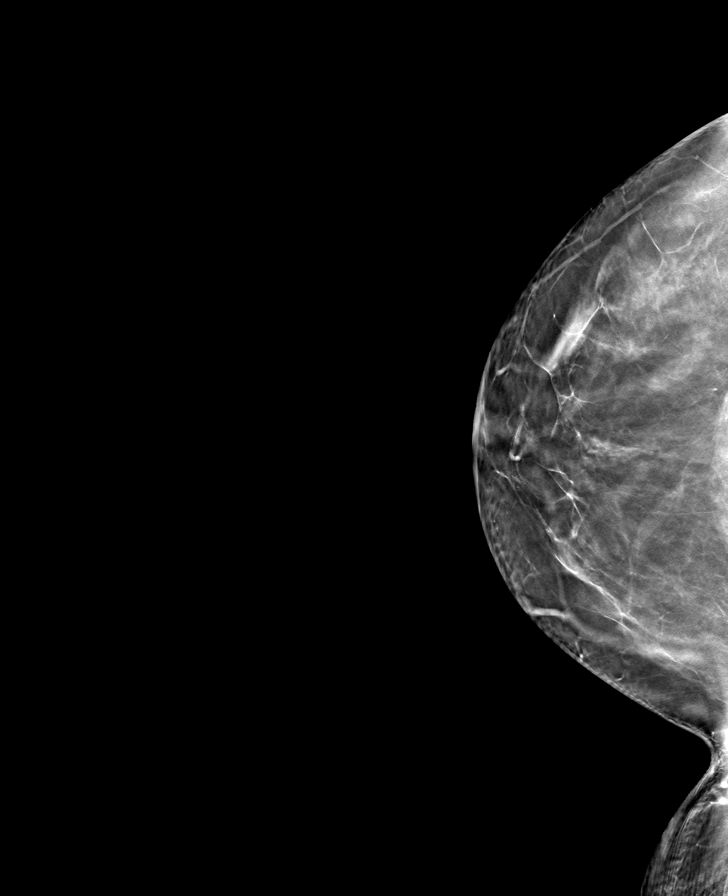

[R MLO tomo · tomo slice 36/71.0]
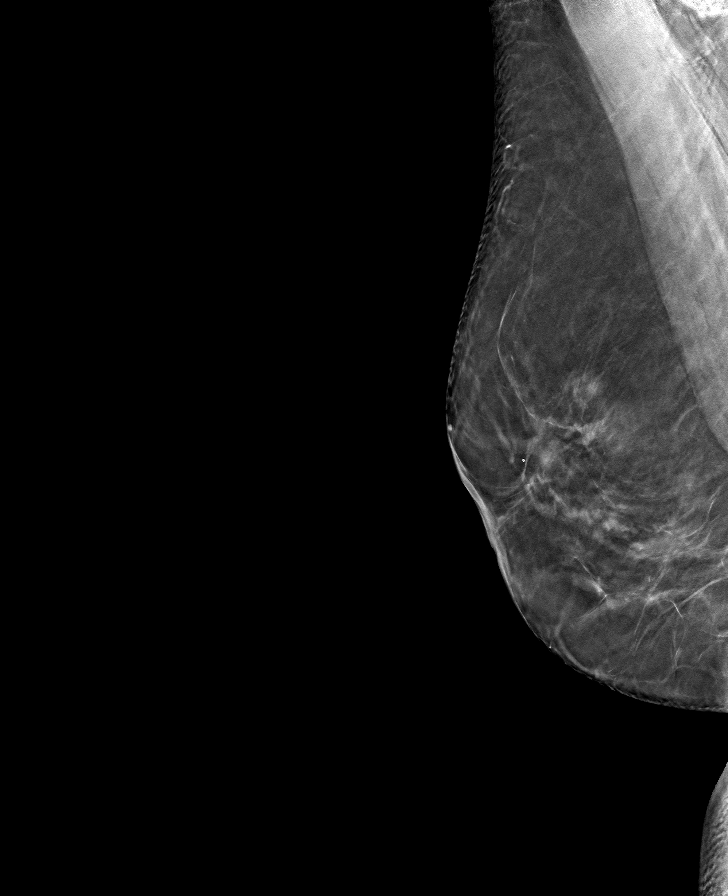

[8 of 24 positions shown; findings below may reference images not displayed]

ACR Breast Density Category b: There are scattered areas of
fibroglandular density.
FINDINGS: There are no findings suspicious for malignancy. Images were
processed with CAD.
IMPRESSION: No mammographic evidence of malignancy. A result letter of this
screening mammogram will be mailed directly to the patient.

RECOMMENDATION:
Screening mammogram in one year. (Code:[id number])

BI-RADS CATEGORY  1: Negative.

## 2021-05-15 ENCOUNTER — Other Ambulatory Visit: Payer: Self-pay | Admitting: Nurse Practitioner

## 2021-05-15 NOTE — Telephone Encounter (Signed)
Scheduled 6/15

## 2021-05-15 NOTE — Telephone Encounter (Signed)
Last fill 02/23/21 for 90 days -- should not need refills until 05/26/21

## 2021-05-15 NOTE — Telephone Encounter (Signed)
Requested medication (s) are due for refill today: no  Requested medication (s) are on the active medication list:  yes   Last refill: 02/23/2021  Future visit scheduled: yes   Notes to clinic: this refill cannot be delegated    Requested Prescriptions  Pending Prescriptions Disp Refills   zolpidem (AMBIEN) 10 MG tablet [Pharmacy Med Name: ZOLPIDEM TARTRATE 10 MG TABLET] 90 tablet     Sig: TAKE ONE TABLET BY MOUTH AT BEDTIME      Not Delegated - Psychiatry:  Anxiolytics/Hypnotics Failed - 05/15/2021  1:51 PM      Failed - This refill cannot be delegated      Failed - Urine Drug Screen completed in last 360 days      Passed - Valid encounter within last 6 months    Recent Outpatient Visits           2 months ago Essential hypertension   [place], Barbaraann Faster, NP   5 months ago Essential hypertension   [place], Glenn T, NP   8 months ago Psychophysiological insomnia   [place], [place], DO   1 year ago Encounter for annual physical exam   [place], Henrine Screws T, NP   1 year ago Encounter to establish care   [facility] [place], Barbaraann Faster, NP       Future Appointments             In 5 days [place], Barbaraann Faster, NP [facility]

## 2021-05-20 ENCOUNTER — Other Ambulatory Visit: Payer: Self-pay

## 2021-05-20 ENCOUNTER — Encounter: Payer: Self-pay | Admitting: Nurse Practitioner

## 2021-05-20 ENCOUNTER — Ambulatory Visit (INDEPENDENT_AMBULATORY_CARE_PROVIDER_SITE_OTHER): Payer: 59 | Admitting: Nurse Practitioner

## 2021-05-20 VITALS — BP 111/72 | HR 69 | Temp 98.2°F | Ht 67.0 in | Wt 148.0 lb

## 2021-05-20 DIAGNOSIS — Z0000 Encounter for general adult medical examination without abnormal findings: Secondary | ICD-10-CM | POA: Diagnosis not present

## 2021-05-20 DIAGNOSIS — Z114 Encounter for screening for human immunodeficiency virus [HIV]: Secondary | ICD-10-CM

## 2021-05-20 DIAGNOSIS — F411 Generalized anxiety disorder: Secondary | ICD-10-CM

## 2021-05-20 DIAGNOSIS — Z1159 Encounter for screening for other viral diseases: Secondary | ICD-10-CM | POA: Diagnosis not present

## 2021-05-20 DIAGNOSIS — Z853 Personal history of malignant neoplasm of breast: Secondary | ICD-10-CM

## 2021-05-20 DIAGNOSIS — E7800 Pure hypercholesterolemia, unspecified: Secondary | ICD-10-CM | POA: Diagnosis not present

## 2021-05-20 DIAGNOSIS — F5104 Psychophysiologic insomnia: Secondary | ICD-10-CM

## 2021-05-20 DIAGNOSIS — I10 Essential (primary) hypertension: Secondary | ICD-10-CM | POA: Diagnosis not present

## 2021-05-20 MED ORDER — ZOLPIDEM TARTRATE 10 MG PO TABS: 10.0000 mg | ORAL_TABLET | Freq: Every day | ORAL | 0 refills | Status: DC

## 2021-05-20 MED ORDER — TRAZODONE HCL 100 MG PO TABS: 100.0000 mg | ORAL_TABLET | Freq: Every day | ORAL | 4 refills | Status: DC

## 2021-05-20 MED ORDER — LISINOPRIL 10 MG PO TABS: 10.0000 mg | ORAL_TABLET | Freq: Every morning | ORAL | 4 refills | Status: DC

## 2021-05-20 MED ORDER — DICYCLOMINE HCL 10 MG PO CAPS: 10.0000 mg | ORAL_CAPSULE | Freq: Three times a day (TID) | ORAL | 4 refills | Status: DC

## 2021-05-20 MED ORDER — SERTRALINE HCL 50 MG PO TABS: 50.0000 mg | ORAL_TABLET | Freq: Every day | ORAL | 4 refills | Status: DC

## 2021-05-20 MED ORDER — ALPRAZOLAM 0.25 MG PO TABS: 0.2500 mg | ORAL_TABLET | Freq: Two times a day (BID) | ORAL | 0 refills | Status: DC

## 2021-05-20 NOTE — Patient Instructions (Signed)

## 2021-05-20 NOTE — Assessment & Plan Note (Signed)
Chronic, stable with BP below goal today.  Will maintain Lisinopril at this time for kidney protection, educated her on this and to alert provider if elevation in BP consistently at home >130/80.  Recommend she continue to monitor BP at home, monitoring a few days a week and documenting for provider.  DASH diet focus at home. LABS today: CMP, TSH, CBC.

## 2021-05-20 NOTE — Assessment & Plan Note (Signed)
Chronic, ongoing since loss of grandson 3 years ago.  Denies SI/HI.  Had at length discussion on risks of long term benzo use, she would like to reduce treatment at this time. Recommend we do this in small increments -- start out by discontinuing evening Xanax dose every other day and then work slowly towards discontinuing dose every evening -- only morning dose taken, when if tolerating will continue to reduce further and work towards discontinuation.  UDS next visit and controlled substance contract up to date.  Refills sent.  Return in 3 months.

## 2021-05-20 NOTE — Progress Notes (Signed)
BP 111/72   Pulse 69   Temp 98.2 F (36.8 C) (Oral)   Ht 5\' 7"  (1.702 m)   Wt 148 lb (67.1 kg)   SpO2 98%   BMI 23.18 kg/m    Subjective:    Patient ID: Lori May, female    DOB: [birth year], 60 y.o.   MRN: [id number]  HPI: Lori May is a 60 y.o. female presenting on 05/20/2021 for comprehensive medical examination. Current medical complaints include:none  She currently lives with: husband Menopausal Symptoms: no   HYPERTENSION Currently taking HCTZ and Lisinopril daily.  Hypertension status: stable  Satisfied with current treatment? yes Duration of hypertension: chronic BP monitoring frequency:  occasionally BP range: 120/80 at home BP medication side effects:  no Medication compliance: good compliance Aspirin: no Recurrent headaches: no Visual changes: no Palpitations: no Dyspnea: no Chest pain: no Lower extremity edema: no Dizzy/lightheaded: no The 10-year ASCVD risk score Mikey Bussing DC Jr., et al., 2013) is: 7.5%   Values used to calculate the score:     Age: 35 years     Sex: Female     Is Non-Hispanic African American: No     Diabetic: No     Tobacco smoker: Yes     Systolic Blood Pressure: 101 mmHg     Is BP treated: Yes     HDL Cholesterol: 68 mg/dL     Total Cholesterol: 239 mg/dL   ANXIETY/STRESS Lost a grandson 3 years ago at young age to brain tumor, that is when she was placed on Xanax and Buspar.  She reports trying different medications -- Zoloft and some others in past.  Pt made aware of risks of benzo medication use to include increased sedation, respiratory suppression, falls, dependence and cardiovascular events.  Pt would like to continue treatment as benefit determined to outweigh risk.     Has taken Ambien and Trazodone for insomnia, taken for > 10 years.  Discussed at length risks of long term Ambien use and that when age 39 will need to lower dose to 5 MG.  Last Ambien fill on PDMP review was 02/23/21 (#90 tablets) and Xanax 02/23/21 (#120  tablets).  She is interested in tapering off Xanax and this was discussed today.  She does endorse irritable bowel symptoms for 30 years -- fluctuates between diarrhea and constipation, bloating, nausea.   Duration:stable Anxious mood: no  Excessive worrying: yes Irritability: no  Sweating: no Nausea: no Palpitations:no Hyperventilation: no Panic attacks: no Agoraphobia: no  Obscessions/compulsions: no Depressed mood: yes Depression screen [facility] 2/9 05/20/2021 02/17/2021 11/19/2020 08/20/2020 05/14/2020  Decreased Interest 0 0 0 0 1  Down, Depressed, Hopeless 0 0 0 0 1  PHQ - 2 Score 0 0 0 0 2  Altered sleeping - 0 0 0 0  Tired, decreased energy - 0 1 1 0  Change in appetite - 0 - 0 0  Feeling bad or failure about yourself  - 0 0 0 0  Trouble concentrating - 0 0 0 0  Moving slowly or fidgety/restless - 0 0 0 0  Suicidal thoughts - 0 0 0 0  PHQ-9 Score - 0 1 1 2   Difficult doing work/chores - - Not difficult at all Not difficult at all Not difficult at all   GAD 7 : Generalized Anxiety Score 05/20/2021 02/17/2021 11/19/2020 08/20/2020  Nervous, Anxious, on Edge 0 0 0 1  Control/stop worrying 0 0 0 1  Worry too much - different things 0 0 0  1  Trouble relaxing 0 0 0 0  Restless 0 0 0 0  Easily annoyed or irritable 0 0 0 1  Afraid - awful might happen 0 0 0 1  Total GAD 7 Score 0 0 0 5  Anxiety Difficulty Not difficult at all Not difficult at all - Not difficult at all     The patient does not have a history of falls. I did not complete a risk assessment for falls. A plan of care for falls was not documented.   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   Cancer ([place]) 2014   right breast LCIS   Hypertension    Postmenopausal 2013    Surgical History:  Past Surgical History:  Procedure Laterality Date   BREAST BIOPSY Left 10/06/2015   ADH   BREAST EXCISIONAL BIOPSY Left 01/2016    lumpectomy to remove ADH   BREAST EXCISIONAL BIOPSY Right 10/26/2013   lumpectomy breast lobular carcinoma    BREAST LUMPECTOMY Right 2014   LCIS   BREAST LUMPECTOMY WITH NEEDLE LOCALIZATION Left 01/07/2016   Procedure: BREAST LUMPECTOMY WITH NEEDLE LOCALIZATION;  Surgeon: Hubbard Robinson, MD;  Location: [facility] ORS;  Service: General;  Laterality: Left;   BREAST SURGERY Right 10/26/2013   Partial Mastectomy   NOVASURE ABLATION  2007    Medications:  Current Outpatient Medications on File Prior to Visit  Medication Sig   acetaminophen (TYLENOL) 500 MG tablet Take 500 mg by mouth as needed.   No current facility-administered medications on file prior to visit.    Allergies:  No Known Allergies  Social History:  Social History   Socioeconomic History   Marital status: Married    Spouse name: Not on file   Number of children: Not on file   Years of education: Not on file   Highest education level: Not on file  Occupational History   Not on file  Tobacco Use   Smoking status: Former    Packs/day: 0.50    Years: 0.00    Pack years: 0.00    Types: Cigarettes    Quit date: 06/09/2016    Years since quitting: 4.9   Smokeless tobacco: Never   Tobacco comments:    ocassional smoker -- social smoker  Vaping Use   Vaping Use: Never used  Substance and Sexual Activity   Alcohol use: Yes    Alcohol/week: 0.0 standard drinks    Comment: wine OCC   Drug use: No   Sexual activity: Yes  Other Topics Concern   Not on file  Social History Narrative   Not on file   Social Determinants of Health   Financial Resource Strain: Low Risk    Difficulty of Paying Living Expenses: Not hard at all  Food Insecurity: No Food Insecurity   Worried About Charity fundraiser in the Last Year: Never true   Liberty in the Last Year: Never true  Transportation Needs: No Transportation Needs   Lack of Transportation (Medical): No   Lack of Transportation (Non-Medical): No  Physical  Activity: Sufficiently Active   Days of Exercise per Week: 4 days   Minutes of Exercise per Session: 40 min  Stress: No Stress Concern Present   Feeling of Stress : Only a little  Social Connections: Moderately Isolated   Frequency of Communication with Friends and Family: More than  three times a week   Frequency of Social Gatherings with Friends and Family: More than three times a week   Attends Religious Services: Never   Marine scientist or Organizations: No   Attends Music therapist: Never   Marital Status: Married  Human resources officer Violence: Not At Risk   Fear of Current or Ex-Partner: No   Emotionally Abused: No   Physically Abused: No   Sexually Abused: No   Social History   Tobacco Use  Smoking Status Former   Packs/day: 0.50   Years: 0.00   Pack years: 0.00   Types: Cigarettes   Quit date: 06/09/2016   Years since quitting: 4.9  Smokeless Tobacco Never  Tobacco Comments   ocassional smoker -- social smoker   Social History   Substance and Sexual Activity  Alcohol Use Yes   Alcohol/week: 0.0 standard drinks   Comment: wine OCC    Family History:  Family History  Problem Relation Age of Onset   Leukemia Mother 22   Heart disease Father    Hypertension Sister    Hypertension Brother    Hypertension Brother     Past medical history, surgical history, medications, allergies, family history and social history reviewed with patient today and changes made to appropriate areas of the chart.   Review of Systems - negative All other ROS negative except what is listed above and in the HPI.      Objective:    BP 111/72   Pulse 69   Temp 98.2 F (36.8 C) (Oral)   Ht 5\' 7"  (1.702 m)   Wt 148 lb (67.1 kg)   SpO2 98%   BMI 23.18 kg/m   Wt Readings from Last 3 Encounters:  05/20/21 148 lb (67.1 kg)  02/17/21 148 lb 6.4 oz (67.3 kg)  11/19/20 149 lb (67.6 kg)    Physical Exam Vitals and nursing note reviewed. Exam conducted with a  chaperone present.  Constitutional:      General: She is awake. She is not in acute distress.    Appearance: She is well-developed. She is not ill-appearing.  HENT:     Head: Normocephalic and atraumatic.     Right Ear: Hearing, tympanic membrane, ear canal and external ear normal. No drainage.     Left Ear: Hearing, tympanic membrane, ear canal and external ear normal. No drainage.     Nose: Nose normal.     Right Sinus: No maxillary sinus tenderness or frontal sinus tenderness.     Left Sinus: No maxillary sinus tenderness or frontal sinus tenderness.     Mouth/Throat:     Mouth: Mucous membranes are moist.     Pharynx: Oropharynx is clear. Uvula midline. No pharyngeal swelling, oropharyngeal exudate or posterior oropharyngeal erythema.  Eyes:     General: Lids are normal.        Right eye: No discharge.        Left eye: No discharge.     Extraocular Movements: Extraocular movements intact.     Conjunctiva/sclera: Conjunctivae normal.     Pupils: Pupils are equal, round, and reactive to light.     Visual Fields: Right eye visual fields normal and left eye visual fields normal.  Neck:     Thyroid: No thyromegaly.     Vascular: No carotid bruit.     Trachea: Trachea normal.  Cardiovascular:     Rate and Rhythm: Normal rate and regular rhythm.     Heart sounds: Normal heart  sounds. No murmur heard.   No gallop.  Pulmonary:     Effort: Pulmonary effort is normal. No accessory muscle usage or respiratory distress.     Breath sounds: Normal breath sounds.  Chest:  Breasts:    Right: Normal. No axillary adenopathy or supraclavicular adenopathy.     Left: Normal. No axillary adenopathy or supraclavicular adenopathy.     Comments: Old scar line to left breast. Abdominal:     General: Bowel sounds are normal.     Palpations: Abdomen is soft. There is no hepatomegaly or splenomegaly.     Tenderness: There is no abdominal tenderness.  Musculoskeletal:        General: Normal range of  motion.     Cervical back: Normal range of motion and neck supple.     Right lower leg: No edema.     Left lower leg: No edema.  Lymphadenopathy:     Head:     Right side of head: No submental, submandibular, tonsillar, preauricular or posterior auricular adenopathy.     Left side of head: No submental, submandibular, tonsillar, preauricular or posterior auricular adenopathy.     Cervical: No cervical adenopathy.     Upper Body:     Right upper body: No supraclavicular, axillary or pectoral adenopathy.     Left upper body: No supraclavicular, axillary or pectoral adenopathy.  Skin:    General: Skin is warm and dry.     Capillary Refill: Capillary refill takes less than 2 seconds.     Findings: No rash.  Neurological:     Mental Status: She is alert and oriented to person, place, and time.     Cranial Nerves: Cranial nerves are intact.     Gait: Gait is intact.     Deep Tendon Reflexes: Reflexes are normal and symmetric.     Reflex Scores:      Brachioradialis reflexes are 2+ on the right side and 2+ on the left side.      Patellar reflexes are 2+ on the right side and 2+ on the left side. Psychiatric:        Attention and Perception: Attention normal.        Mood and Affect: Mood normal.        Speech: Speech normal.        Behavior: Behavior normal. Behavior is cooperative.        Thought Content: Thought content normal.        Judgment: Judgment normal.    Results for orders placed or performed in visit on 05/29/20  Cologuard  Result Value Ref Range   Cologuard Negative Negative      Assessment & Plan:   Problem List Items Addressed This Visit       Cardiovascular and Mediastinum   Essential hypertension - Primary    Chronic, stable with BP below goal today.  Will maintain Lisinopril at this time for kidney protection, educated her on this and to alert provider if elevation in BP consistently at home >130/80.  Recommend she continue to monitor BP at home, monitoring a  few days a week and documenting for provider.  DASH diet focus at home. LABS today: CMP, TSH, CBC.       Relevant Medications   lisinopril (ZESTRIL) 10 MG tablet   Other Relevant Orders   CBC with Differential/Platelet   Comprehensive metabolic panel   TSH     Other   History of left breast cancer    Diagnostic mammogram  ordered.       Relevant Orders   MM Digital Diagnostic Bilat   Generalized anxiety disorder    Chronic, ongoing since loss of grandson 3 years ago.  Denies SI/HI.  Had at length discussion on risks of long term benzo use, she would like to reduce treatment at this time. Recommend we do this in small increments -- start out by discontinuing evening Xanax dose every other day and then work slowly towards discontinuing dose every evening -- only morning dose taken, when if tolerating will continue to reduce further and work towards discontinuation.  UDS next visit and controlled substance contract up to date.  Refills sent.  Return in 3 months.       Relevant Medications   sertraline (ZOLOFT) 50 MG tablet   traZODone (DESYREL) 100 MG tablet   ALPRAZolam (XANAX) 0.25 MG tablet   Insomnia    Chronic, ongoing for several years.  Continue current medication regimen to include Ambien and Trazodone, discussed at length risks of long term Ambien use and that with age will need to reduce dose.  UDS next visit and controlled substance contract up to date.  Recommend continued focus on healthy sleep hygiene techniques. Refill sent. Return in 3 months.       Elevated LDL cholesterol level    Noted on past labs with ASCVD 7.5%.  Educated patient on findings on labs and risk scoring.  At this time continue diet focus and recheck lipid panel today.       Relevant Orders   Lipid Panel w/o Chol/HDL Ratio   Other Visit Diagnoses     Need for hepatitis C screening test       Hep C screening today per guideline recommendation for one time screening.   Relevant Orders    Hepatitis C antibody   Encounter for screening for HIV       HIV screening today per guideline recommendation for one time screening.   Relevant Orders   HIV Antibody (routine testing w rflx)   Annual physical exam       Annual labs today and health maintenance reviewed with patient, she will look into shingrix with her insurance company.        Follow up plan: Return in about 3 months (around 08/20/2021) for MOOD -- UDS needed.   LABORATORY TESTING:  - Pap smear: up to date  IMMUNIZATIONS:   - Tdap: Tetanus vaccination status reviewed: last tetanus booster within 10 years. - Influenza: Up to date - Pneumovax: Not applicable - Prevnar: Not applicable - HPV: Not applicable - Zostavax vaccine: Refused  SCREENING: -Mammogram: Ordered today -- due - Colonoscopy: Cologuard negative 05/26/2020 - Bone Density: Not applicable  -Hearing Test: Not applicable  -Spirometry: Not applicable   PATIENT COUNSELING:   Advised to take 1 mg of folate supplement per day if capable of pregnancy.   Sexuality: Discussed sexually transmitted diseases, partner selection, use of condoms, avoidance of unintended pregnancy  and contraceptive alternatives.   Advised to avoid cigarette smoking.  I discussed with the patient that most people either abstain from alcohol or drink within safe limits (<=14/week and <=4 drinks/occasion for males, <=7/weeks and <= 3 drinks/occasion for females) and that the risk for alcohol disorders and other health effects rises proportionally with the number of drinks per week and how often a drinker exceeds daily limits.  Discussed cessation/primary prevention of drug use and availability of treatment for abuse.   Diet: Encouraged to adjust caloric intake to  maintain  or achieve ideal body weight, to reduce intake of dietary saturated fat and total fat, to limit sodium intake by avoiding high sodium foods and not adding table salt, and to maintain adequate dietary potassium  and calcium preferably from fresh fruits, vegetables, and low-fat dairy products.    stressed the importance of regular exercise  Injury prevention: Discussed safety belts, safety helmets, smoke detector, smoking near bedding or upholstery.   Dental health: Discussed importance of regular tooth brushing, flossing, and dental visits.    NEXT PREVENTATIVE PHYSICAL DUE IN 1 YEAR. Return in about 3 months (around 08/20/2021) for MOOD -- UDS needed.

## 2021-05-20 NOTE — Assessment & Plan Note (Signed)
Chronic, ongoing for several years.  Continue current medication regimen to include Ambien and Trazodone, discussed at length risks of long term Ambien use and that with age will need to reduce dose.  UDS next visit and controlled substance contract up to date.  Recommend continued focus on healthy sleep hygiene techniques. Refill sent. Return in 3 months.

## 2021-05-20 NOTE — Assessment & Plan Note (Signed)
Diagnostic mammogram ordered.  

## 2021-05-20 NOTE — Assessment & Plan Note (Signed)
Noted on past labs with ASCVD 7.5%.  Educated patient on findings on labs and risk scoring.  At this time continue diet focus and recheck lipid panel today.

## 2021-05-21 LAB — CBC WITH DIFFERENTIAL/PLATELET
Basophils Absolute: 0 10*3/uL (ref 0.0–0.2)
Basos: 0 %
EOS (ABSOLUTE): 0.1 10*3/uL (ref 0.0–0.4)
Eos: 1 %
Hematocrit: 40.8 % (ref 34.0–46.6)
Hemoglobin: 14.2 g/dL (ref 11.1–15.9)
Immature Grans (Abs): 0 10*3/uL (ref 0.0–0.1)
Immature Granulocytes: 0 %
Lymphocytes Absolute: 1.6 10*3/uL (ref 0.7–3.1)
Lymphs: 29 %
MCH: 32.6 pg (ref 26.6–33.0)
MCHC: 34.8 g/dL (ref 31.5–35.7)
MCV: 94 fL (ref 79–97)
Monocytes Absolute: 0.5 10*3/uL (ref 0.1–0.9)
Monocytes: 8 %
Neutrophils Absolute: 3.4 10*3/uL (ref 1.4–7.0)
Neutrophils: 62 %
Platelets: 227 10*3/uL (ref 150–450)
RBC: 4.36 x10E6/uL (ref 3.77–5.28)
RDW: 12.3 % (ref 11.7–15.4)
WBC: 5.5 10*3/uL (ref 3.4–10.8)

## 2021-05-21 LAB — COMPREHENSIVE METABOLIC PANEL
ALT: 11 IU/L (ref 0–32)
AST: 22 IU/L (ref 0–40)
Albumin/Globulin Ratio: 2.2 (ref 1.2–2.2)
Albumin: 4.9 g/dL — ABNORMAL HIGH (ref 3.8–4.8)
Alkaline Phosphatase: 77 IU/L (ref 44–121)
BUN/Creatinine Ratio: 14 (ref 12–28)
BUN: 10 mg/dL (ref 8–27)
Bilirubin Total: 0.3 mg/dL (ref 0.0–1.2)
CO2: 18 mmol/L — ABNORMAL LOW (ref 20–29)
Calcium: 9.6 mg/dL (ref 8.7–10.3)
Chloride: 100 mmol/L (ref 96–106)
Creatinine, Ser: 0.71 mg/dL (ref 0.57–1.00)
Globulin, Total: 2.2 g/dL (ref 1.5–4.5)
Glucose: 88 mg/dL (ref 65–99)
Potassium: 4.1 mmol/L (ref 3.5–5.2)
Sodium: 138 mmol/L (ref 134–144)
Total Protein: 7.1 g/dL (ref 6.0–8.5)
eGFR: 97 mL/min/{1.73_m2} (ref >59)

## 2021-05-21 LAB — LIPID PANEL W/O CHOL/HDL RATIO
Cholesterol, Total: 223 mg/dL — ABNORMAL HIGH (ref 100–199)
HDL: 83 mg/dL (ref >39)
LDL Chol Calc (NIH): 110 mg/dL — ABNORMAL HIGH (ref 0–99)
Triglycerides: 178 mg/dL — ABNORMAL HIGH (ref 0–149)
VLDL Cholesterol Cal: 30 mg/dL (ref 5–40)

## 2021-05-21 LAB — HIV ANTIBODY (ROUTINE TESTING W REFLEX): HIV Screen 4th Generation wRfx: NONREACTIVE

## 2021-05-21 LAB — HEPATITIS C ANTIBODY: Hep C Virus Ab: <0.1 s/co ratio (ref 0.0–0.9)

## 2021-05-21 LAB — TSH: TSH: 2.05 u[IU]/mL (ref 0.450–4.500)

## 2021-05-21 NOTE — Progress Notes (Signed)
Contacted via MyChart The 10-year ASCVD risk score Mikey Bussing DC Jr., et al., 2013) is: 6.3%   Values used to calculate the score:     Age: 60 years     Sex: Female     Is Non-Hispanic African American: No     Diabetic: No     Tobacco smoker: Yes     Systolic Blood Pressure: 846 mmHg     Is BP treated: Yes     HDL Cholesterol: 83 mg/dL     Total Cholesterol: 223 mg/dL   Good morning Kerianne, your labs have returned and overall look great with exception of cholesterol levels which remain elevated, but improved from last check.  Your cholesterol is still high, but continued recommendations to make lifestyle changes. Your LDL is above normal. The LDL is the bad cholesterol. Over time and in combination with inflammation and other factors, this contributes to plaque which in turn may lead to stroke and/or heart attack down the road. Sometimes high LDL is primarily genetic, and people might be eating all the right foods but still have high numbers. Other times, there is room for improvement in one's diet and eating healthier can bring this number down and potentially reduce one's risk of heart attack and/or stroke.   To reduce your LDL, Remember - more fruits and vegetables, more fish, and limit red meat and dairy products. More soy, nuts, beans, barley, lentils, oats and plant sterol ester enriched margarine instead of butter. I also encourage eliminating sugar and processed food. Remember, shop on the outside of the grocery store and visit your [employer]. If you would like to talk with me about dietary changes for your cholesterol, please let me know. We should recheck your cholesterol in 6 months.  Any questions? Keep being awesome!!  Thank you for allowing me to participate in your care.  I appreciate you. Kindest regards, Eliasar Hlavaty

## 2021-07-06 ENCOUNTER — Other Ambulatory Visit: Payer: Self-pay | Admitting: Nurse Practitioner

## 2021-07-06 DIAGNOSIS — Z1231 Encounter for screening mammogram for malignant neoplasm of breast: Secondary | ICD-10-CM

## 2021-07-21 ENCOUNTER — Other Ambulatory Visit: Payer: Self-pay

## 2021-07-21 ENCOUNTER — Ambulatory Visit
Admission: RE | Admit: 2021-07-21 | Discharge: 2021-07-21 | Disposition: A | Discharge status: Home / Self Care | Payer: 59 | Source: Ambulatory Visit | Attending: Nurse Practitioner | Admitting: Nurse Practitioner

## 2021-07-21 DIAGNOSIS — Z1231 Encounter for screening mammogram for malignant neoplasm of breast: Secondary | ICD-10-CM | POA: Diagnosis present

## 2021-08-25 ENCOUNTER — Encounter: Payer: Self-pay | Admitting: Nurse Practitioner

## 2021-08-25 ENCOUNTER — Other Ambulatory Visit: Payer: Self-pay

## 2021-08-25 ENCOUNTER — Ambulatory Visit (INDEPENDENT_AMBULATORY_CARE_PROVIDER_SITE_OTHER): Payer: 59 | Admitting: Nurse Practitioner

## 2021-08-25 VITALS — BP 121/76 | HR 59 | Temp 98.4°F | Wt 145.2 lb

## 2021-08-25 DIAGNOSIS — F411 Generalized anxiety disorder: Secondary | ICD-10-CM | POA: Diagnosis not present

## 2021-08-25 DIAGNOSIS — F5104 Psychophysiologic insomnia: Secondary | ICD-10-CM | POA: Diagnosis not present

## 2021-08-25 DIAGNOSIS — Z79899 Other long term (current) drug therapy: Secondary | ICD-10-CM | POA: Insufficient documentation

## 2021-08-25 MED ORDER — ALPRAZOLAM 0.25 MG PO TABS: 0.2500 mg | ORAL_TABLET | Freq: Two times a day (BID) | ORAL | 0 refills | Status: DC

## 2021-08-25 NOTE — Progress Notes (Signed)
BP 121/76   Pulse (!) 59   Temp 98.4 F (36.9 C) (Oral)   Wt 145 lb 3.2 oz (65.9 kg)   SpO2 98%   BMI 22.74 kg/m    Subjective:    Patient ID: Lori May, female    DOB: 1960-12-29, 60 y.o.   MRN: [id number]  HPI: Lori May is a 60 y.o. female  Chief Complaint  Patient presents with   mood    Patient is here to follow up on Mood. Patient denies having any concerns at today's visit.    ANXIETY/STRESS Medication follow-up today.  History of loss of grandson over 2 years ago when she started current regimen.  She reports trying different medications -- Zoloft and some others in past.  Pt made aware of risks of benzo medication use to include increased sedation, respiratory suppression, falls, dependence and cardiovascular events.  Pt would like to continue treatment as benefit determined to outweigh risk.  Taking Xanax two times a day.   Has taken Ambien and Trazodone for insomnia, taken for > 10 years.  Discussed at length risks of long term Ambien use and that when age 31 will need to lower dose to 5 MG.  Last Ambien fill on PDMP review was 08/17/21 (#30 tablets) and Xanax 05/20/21 (#60 tablets).    UDS last performed 05/14/2020 and contract signed 05/28/20. Duration:stable Anxious mood: no  Excessive worrying: yes Irritability: no  Sweating: no Nausea: no Palpitations:no Hyperventilation: no Panic attacks: no Agoraphobia: no  Obscessions/compulsions: no Depressed mood: yes Depression screen [facility] 2/9 08/25/2021 05/20/2021 02/17/2021 11/19/2020 08/20/2020  Decreased Interest 0 0 0 0 0  Down, Depressed, Hopeless 0 0 0 0 0  PHQ - 2 Score 0 0 0 0 0  Altered sleeping 0 - 0 0 0  Tired, decreased energy 0 - 0 1 1  Change in appetite 0 - 0 - 0  Feeling bad or failure about yourself  0 - 0 0 0  Trouble concentrating 0 - 0 0 0  Moving slowly or fidgety/restless 0 - 0 0 0  Suicidal thoughts 0 - 0 0 0  PHQ-9 Score 0 - 0 1 1  Difficult doing work/chores - - - Not difficult at all  Not difficult at all   GAD 7 : Generalized Anxiety Score 08/25/2021 05/20/2021 02/17/2021 11/19/2020  Nervous, Anxious, on Edge 0 0 0 0  Control/stop worrying 0 0 0 0  Worry too much - different things 0 0 0 0  Trouble relaxing 0 0 0 0  Restless 0 0 0 0  Easily annoyed or irritable 0 0 0 0  Afraid - awful might happen 0 0 0 0  Total GAD 7 Score 0 0 0 0  Anxiety Difficulty Not difficult at all Not difficult at all Not difficult at all -    Relevant past medical, surgical, family and social history reviewed and updated as indicated. Interim medical history since our last visit reviewed. Allergies and medications reviewed and updated.  Review of Systems  Constitutional:  Negative for activity change, appetite change, diaphoresis, fatigue and fever.  Respiratory:  Negative for cough, chest tightness and shortness of breath.   Cardiovascular:  Negative for chest pain, palpitations and leg swelling.  Gastrointestinal: Negative.   Neurological: Negative.   Psychiatric/Behavioral:  Positive for sleep disturbance. Negative for decreased concentration, self-injury and suicidal ideas. The patient is nervous/anxious.    Per HPI unless specifically indicated above     Objective:  BP 121/76   Pulse (!) 59   Temp 98.4 F (36.9 C) (Oral)   Wt 145 lb 3.2 oz (65.9 kg)   SpO2 98%   BMI 22.74 kg/m   Wt Readings from Last 3 Encounters:  08/25/21 145 lb 3.2 oz (65.9 kg)  05/20/21 148 lb (67.1 kg)  02/17/21 148 lb 6.4 oz (67.3 kg)    Physical Exam Vitals and nursing note reviewed.  Constitutional:      General: She is awake. She is not in acute distress.    Appearance: She is well-developed, well-groomed and overweight. She is not ill-appearing.  HENT:     Head: Normocephalic.     Right Ear: Hearing normal.     Left Ear: Hearing normal.  Eyes:     General: Lids are normal.        Right eye: No discharge.        Left eye: No discharge.     Conjunctiva/sclera: Conjunctivae normal.      Pupils: Pupils are equal, round, and reactive to light.  Neck:     Thyroid: No thyromegaly.     Vascular: No carotid bruit.  Cardiovascular:     Rate and Rhythm: Normal rate and regular rhythm.     Heart sounds: Normal heart sounds. No murmur heard.   No gallop.  Pulmonary:     Effort: Pulmonary effort is normal. No accessory muscle usage or respiratory distress.     Breath sounds: Normal breath sounds.  Abdominal:     General: Bowel sounds are normal.     Palpations: Abdomen is soft.  Musculoskeletal:     Cervical back: Normal range of motion and neck supple.     Right lower leg: No edema.     Left lower leg: No edema.  Lymphadenopathy:     Cervical: No cervical adenopathy.  Skin:    General: Skin is warm and dry.  Neurological:     Mental Status: She is alert and oriented to person, place, and time.  Psychiatric:        Attention and Perception: Attention normal.        Mood and Affect: Mood normal.        Speech: Speech normal.        Behavior: Behavior normal. Behavior is cooperative.        Thought Content: Thought content normal.    Results for orders placed or performed in visit on 05/20/21  CBC with Differential/Platelet  Result Value Ref Range   WBC 5.5 3.4 - 10.8 x10E3/uL   RBC 4.36 3.77 - 5.28 x10E6/uL   Hemoglobin 14.2 11.1 - 15.9 g/dL   Hematocrit 40.8 34.0 - 46.6 %   MCV 94 79 - 97 fL   MCH 32.6 26.6 - 33.0 pg   MCHC 34.8 31.5 - 35.7 g/dL   RDW 12.3 11.7 - 15.4 %   Platelets 227 150 - 450 x10E3/uL   Neutrophils 62 Not Estab. %   Lymphs 29 Not Estab. %   Monocytes 8 Not Estab. %   Eos 1 Not Estab. %   Basos 0 Not Estab. %   Neutrophils Absolute 3.4 1.4 - 7.0 x10E3/uL   Lymphocytes Absolute 1.6 0.7 - 3.1 x10E3/uL   Monocytes Absolute 0.5 0.1 - 0.9 x10E3/uL   EOS (ABSOLUTE) 0.1 0.0 - 0.4 x10E3/uL   Basophils Absolute 0.0 0.0 - 0.2 x10E3/uL   Immature Granulocytes 0 Not Estab. %   Immature Grans (Abs) 0.0 0.0 - 0.1 x10E3/uL  Comprehensive metabolic  panel  Result Value Ref Range   Glucose 88 65 - 99 mg/dL   BUN 10 8 - 27 mg/dL   Creatinine, Ser 0.71 0.57 - 1.00 mg/dL   eGFR 97 >59 mL/min/1.73   BUN/Creatinine Ratio 14 12 - 28   Sodium 138 134 - 144 mmol/L   Potassium 4.1 3.5 - 5.2 mmol/L   Chloride 100 96 - 106 mmol/L   CO2 18 (L) 20 - 29 mmol/L   Calcium 9.6 8.7 - 10.3 mg/dL   Total Protein 7.1 6.0 - 8.5 g/dL   Albumin 4.9 (H) 3.8 - 4.8 g/dL   Globulin, Total 2.2 1.5 - 4.5 g/dL   Albumin/Globulin Ratio 2.2 1.2 - 2.2   Bilirubin Total 0.3 0.0 - 1.2 mg/dL   Alkaline Phosphatase 77 44 - 121 IU/L   AST 22 0 - 40 IU/L   ALT 11 0 - 32 IU/L  TSH  Result Value Ref Range   TSH 2.050 0.450 - 4.500 uIU/mL  Lipid Panel w/o Chol/HDL Ratio  Result Value Ref Range   Cholesterol, Total 223 (H) 100 - 199 mg/dL   Triglycerides 178 (H) 0 - 149 mg/dL   HDL 83 >39 mg/dL   VLDL Cholesterol Cal 30 5 - 40 mg/dL   LDL Chol Calc (NIH) 110 (H) 0 - 99 mg/dL  HIV Antibody (routine testing w rflx)  Result Value Ref Range   HIV Screen 4th Generation wRfx Non Reactive Non Reactive  Hepatitis C antibody  Result Value Ref Range   Hep C Virus Ab <0.1 0.0 - 0.9 s/co ratio      Assessment & Plan:   Problem List Items Addressed This Visit       Other   Generalized anxiety disorder - Primary    Chronic, ongoing since loss of grandson > 3 years ago.  Denies SI/HI.  Had at length discussion on risks of long term benzo use, she is aware in future would benefit reductions. Recommend we do this in small increments -- start out by discontinuing evening Xanax dose every other day and then work slowly towards discontinuing dose every evening -- only morning dose taken, then if tolerating will continue to reduce further and work towards discontinuation.  UDS today.  Refills sent.  Return in 3 months.      Relevant Medications   ALPRAZolam (XANAX) 0.25 MG tablet   Insomnia    Chronic, ongoing for several years.  Continue current medication regimen to include  Ambien and Trazodone, discussed at length risks of long term Ambien use and that with age will need to reduce dose.  UDS today and controlled substance contract up to date.  Recommend continued focus on healthy sleep hygiene techniques. Refill up to date. Return in 3 months.      Long term prescription benzodiazepine use    Refer to anxiety plan of care, UDS today.      Relevant Orders   [id number] 11+Oxyco+Alc+Crt-Bund     Follow up plan: Return in about 3 months (around 11/24/2021) for [place] -- may be virtual if patient requests.

## 2021-08-25 NOTE — Assessment & Plan Note (Signed)
Refer to anxiety plan of care, UDS today.

## 2021-08-25 NOTE — Assessment & Plan Note (Signed)
Chronic, ongoing since loss of grandson > 3 years ago.  Denies SI/HI.  Had at length discussion on risks of long term benzo use, she is aware in future would benefit reductions. Recommend we do this in small increments -- start out by discontinuing evening Xanax dose every other day and then work slowly towards discontinuing dose every evening -- only morning dose taken, then if tolerating will continue to reduce further and work towards discontinuation.  UDS today.  Refills sent.  Return in 3 months.

## 2021-08-25 NOTE — Patient Instructions (Signed)
Generalized Anxiety Disorder, Adult Generalized anxiety disorder (GAD) is a mental health condition. Unlike normal worries, anxiety related to GAD is not triggered by a specific event. These worries do not fade or get better with time. GAD interferes with relationships, work, and school. GAD symptoms can vary from mild to severe. People with severe GAD can have intense waves of anxiety with physical symptoms that are similar to panic attacks. What are the causes? The exact cause of GAD is not known, but the following are believed to have an impact: Differences in natural brain chemicals. Genes passed down from parents to children. Differences in the way threats are perceived. Development during childhood. Personality. What increases the risk? The following factors may make you more likely to develop this condition: Being female. Having a family history of anxiety disorders. Being very shy. Experiencing very stressful life events, such as the death of a loved one. Having a very stressful family environment. What are the signs or symptoms? People with GAD often worry excessively about many things in their lives, such as their health and family. Symptoms may also include: Mental and emotional symptoms: Worrying excessively about natural disasters. Fear of being late. Difficulty concentrating. Fears that others are judging your performance. Physical symptoms: Fatigue. Headaches, muscle tension, muscle twitches, trembling, or feeling shaky. Feeling like your heart is pounding or beating very fast. Feeling out of breath or like you cannot take a deep breath. Having trouble falling asleep or staying asleep, or experiencing restlessness. Sweating. Nausea, diarrhea, or irritable bowel syndrome (IBS). Behavioral symptoms: Experiencing erratic moods or irritability. Avoidance of new situations. Avoidance of people. Extreme difficulty making decisions. How is this diagnosed? This condition  is diagnosed based on your symptoms and medical history. You will also have a physical exam. Your health care provider may perform tests to rule out other possible causes of your symptoms. To be diagnosed with GAD, a person must have anxiety that: Is out of his or her control. Affects several different aspects of his or her life, such as work and relationships. Causes distress that makes him or her unable to take part in normal activities. Includes at least three symptoms of GAD, such as restlessness, fatigue, trouble concentrating, irritability, muscle tension, or sleep problems. Before your health care provider can confirm a diagnosis of GAD, these symptoms must be present more days than they are not, and they must last for 6 months or longer. How is this treated? This condition may be treated with: Medicine. Antidepressant medicine is usually prescribed for long-term daily control. Anti-anxiety medicines may be added in severe cases, especially when panic attacks occur. Talk therapy (psychotherapy). Certain types of talk therapy can be helpful in treating GAD by providing support, education, and guidance. Options include: Cognitive behavioral therapy (CBT). People learn coping skills and self-calming techniques to ease their physical symptoms. They learn to identify unrealistic thoughts and behaviors and to replace them with more appropriate thoughts and behaviors. Acceptance and commitment therapy (ACT). This treatment teaches people how to be mindful as a way to cope with unwanted thoughts and feelings. Biofeedback. This process trains you to manage your body's response (physiological response) through breathing techniques and relaxation methods. You will work with a therapist while machines are used to monitor your physical symptoms. Stress management techniques. These include yoga, meditation, and exercise. A mental health specialist can help determine which treatment is best for you. Some  people see improvement with one type of therapy. However, other people require a combination   of therapies. Follow these instructions at home: Lifestyle Maintain a consistent routine and schedule. Anticipate stressful situations. Create a plan, and allow extra time to work with your plan. Practice stress management or self-calming techniques that you have learned from your therapist or your health care provider. General instructions Take over-the-counter and prescription medicines only as told by your health care provider. Understand that you are likely to have setbacks. Accept this and be kind to yourself as you persist to take better care of yourself. Recognize and accept your accomplishments, even if you judge them as small. Keep all follow-up visits as told by your health care provider. This is important. Contact a health care provider if: Your symptoms do not get better. Your symptoms get worse. You have signs of depression, such as: A persistently sad or irritable mood. Loss of enjoyment in activities that used to bring you joy. Change in weight or eating. Changes in sleeping habits. Avoiding friends or family members. Loss of energy for normal tasks. Feelings of guilt or worthlessness. Get help right away if: You have serious thoughts about hurting yourself or others. If you ever feel like you may hurt yourself or others, or have thoughts about taking your own life, get help right away. Go to your nearest emergency department or: Call your local emergency services (911 in the [place] Call a suicide crisis helpline, such as the National Suicide Prevention Lifeline at 1-800-273-8255. This is open 24 hours a day in the [place] Text the Crisis Text Line at 741741 (in the [place] Summary Generalized anxiety disorder (GAD) is a mental health condition that involves worry that is not triggered by a specific event. People with GAD often worry excessively about many things in their lives, such  as their health and family. GAD may cause symptoms such as restlessness, trouble concentrating, sleep problems, frequent sweating, nausea, diarrhea, headaches, and trembling or muscle twitching. A mental health specialist can help determine which treatment is best for you. Some people see improvement with one type of therapy. However, other people require a combination of therapies. This information is not intended to replace advice given to you by your health care provider. Make sure you discuss any questions you have with your health care provider. Document Revised: 09/12/2019 Document Reviewed: 09/12/2019 Elsevier Patient Education  2022 [employer]vier Inc.  

## 2021-08-25 NOTE — Assessment & Plan Note (Addendum)
Chronic, ongoing for several years.  Continue current medication regimen to include Ambien and Trazodone, discussed at length risks of long term Ambien use and that with age will need to reduce dose.  UDS today and controlled substance contract up to date.  Recommend continued focus on healthy sleep hygiene techniques. Refill up to date. Return in 3 months.

## 2021-08-29 LAB — DRUG SCREEN 764883 11+OXYCO+ALC+CRT-BUND
Amphetamines, Urine: NEGATIVE ng/mL
Barbiturate: NEGATIVE ng/mL
Cannabinoid Quant, Ur: NEGATIVE ng/mL
Cocaine (Metabolite): NEGATIVE ng/mL
Creatinine: 96.7 mg/dL (ref 20.0–300.0)
Ethanol: NEGATIVE %
Meperidine: NEGATIVE ng/mL
Methadone Screen, Urine: NEGATIVE ng/mL
OPIATE SCREEN URINE: NEGATIVE ng/mL
Oxycodone/Oxymorphone, Urine: NEGATIVE ng/mL
Phencyclidine: NEGATIVE ng/mL
Propoxyphene: NEGATIVE ng/mL
Tramadol: NEGATIVE ng/mL
pH, Urine: 7.8 (ref 4.5–8.9)

## 2021-08-29 LAB — BENZODIAZEPINES CONFIRM, URINE
Alprazolam Conf.: 115 ng/mL
Alprazolam: POSITIVE — AB
Benzodiazepines: POSITIVE ng/mL — AB
Clonazepam: NEGATIVE
Flurazepam: NEGATIVE
Lorazepam: NEGATIVE
Midazolam: NEGATIVE
Nordiazepam: NEGATIVE
Oxazepam: NEGATIVE
Temazepam: NEGATIVE
Triazolam: NEGATIVE

## 2021-10-14 ENCOUNTER — Other Ambulatory Visit: Payer: Self-pay | Admitting: Nurse Practitioner

## 2021-10-14 NOTE — Telephone Encounter (Signed)
Requested Prescriptions  Pending Prescriptions Disp Refills  . dicyclomine (BENTYL) 10 MG capsule [Pharmacy Med Name: DICYCLOMINE 10 MG CAPSULE] 90 capsule 4    Sig: TAKE ONE CAPSULE BY MOUTH THREE TIMES A DAY BEFORE MEALS     Gastroenterology:  Antispasmodic Agents Passed - 10/14/2021  6:21 AM      Passed - Last Heart Rate in normal range    Pulse Readings from Last 1 Encounters:  08/25/21 (!) 59         Passed - Valid encounter within last 12 months    Recent Outpatient Visits          1 month ago Generalized anxiety disorder   [place], [place] T, NP   4 months ago Essential hypertension   [place], [place] T, NP   7 months ago Essential hypertension   [place], Barbaraann Faster, NP   10 months ago Essential hypertension   [place], Henrine Screws T, NP   1 year ago Psychophysiological insomnia   [place], [place], DO

## 2021-11-16 ENCOUNTER — Other Ambulatory Visit: Payer: Self-pay | Admitting: Nurse Practitioner

## 2021-11-16 NOTE — Telephone Encounter (Signed)
Requested medication (s) are due for refill today - yes  Requested medication (s) are on the active medication list -yes  Future visit scheduled -yes  Last refill: 05/25/21 #90  Notes to clinic: Request RF: non delegated Rx  Requested Prescriptions  Pending Prescriptions Disp Refills   zolpidem (AMBIEN) 10 MG tablet [Pharmacy Med Name: ZOLPIDEM TARTRATE 10 MG TABLET] 90 tablet     Sig: TAKE ONE TABLET BY MOUTH AT BEDTIME     Not Delegated - Psychiatry:  Anxiolytics/Hypnotics Failed - 11/16/2021  9:31 AM      Failed - This refill cannot be delegated      Passed - Urine Drug Screen completed in last 360 days      Passed - Valid encounter within last 6 months    Recent Outpatient Visits           2 months ago Generalized anxiety disorder   [place], Barbaraann Faster, NP   6 months ago Essential hypertension   [place], [place] T, NP   9 months ago Essential hypertension   [place], [place] T, NP   12 months ago Essential hypertension   [place], [place] T, NP   1 year ago Psychophysiological insomnia   [place], [place], DO                 Requested Prescriptions  Pending Prescriptions Disp Refills   zolpidem (AMBIEN) 10 MG tablet [Pharmacy Med Name: ZOLPIDEM TARTRATE 10 MG TABLET] 90 tablet     Sig: TAKE ONE TABLET BY MOUTH AT BEDTIME     Not Delegated - Psychiatry:  Anxiolytics/Hypnotics Failed - 11/16/2021  9:31 AM      Failed - This refill cannot be delegated      Passed - Urine Drug Screen completed in last 360 days      Passed - Valid encounter within last 6 months    Recent Outpatient Visits           2 months ago Generalized anxiety disorder   [place], Barbaraann Faster, NP   6 months ago Essential hypertension   [place], [place] T, NP   9 months ago Essential hypertension   [place], [place] T, NP   12 months ago Essential hypertension   [place], Henrine Screws T, NP   1 year ago Psychophysiological insomnia   [place], [place], DO                c

## 2021-11-24 ENCOUNTER — Telehealth (INDEPENDENT_AMBULATORY_CARE_PROVIDER_SITE_OTHER): Payer: 59 | Admitting: Nurse Practitioner

## 2021-11-24 ENCOUNTER — Encounter: Payer: Self-pay | Admitting: Nurse Practitioner

## 2021-11-24 DIAGNOSIS — F5104 Psychophysiologic insomnia: Secondary | ICD-10-CM

## 2021-11-24 DIAGNOSIS — F411 Generalized anxiety disorder: Secondary | ICD-10-CM | POA: Diagnosis not present

## 2021-11-24 DIAGNOSIS — Z79899 Other long term (current) drug therapy: Secondary | ICD-10-CM | POA: Diagnosis not present

## 2021-11-24 MED ORDER — ALPRAZOLAM 0.25 MG PO TABS: 0.2500 mg | ORAL_TABLET | Freq: Two times a day (BID) | ORAL | 0 refills | Status: DC

## 2021-11-24 NOTE — Assessment & Plan Note (Signed)
Refer to anxiety plan of care, UDS today.

## 2021-11-24 NOTE — Assessment & Plan Note (Signed)
Chronic, ongoing for several years.  Continue current medication regimen to include Ambien and Trazodone, discussed at length risks of long term Ambien use and that with age will need to reduce dose.  UDS up to date and controlled substance contract up to date.  Recommend continued focus on healthy sleep hygiene techniques. Refill up to date. Return in 3 months.

## 2021-11-24 NOTE — Progress Notes (Signed)
There were no vitals taken for this visit.   Subjective:    Patient ID: Lori May, female    DOB: 03/18/1961, 60 y.o.   MRN: [id number]  HPI: Lori May is a 60 y.o. female  Chief Complaint  Patient presents with   Insomnia   Mood   This visit was completed via video visit through MyChart due to the restrictions of the COVID-19 pandemic. All issues as above were discussed and addressed. Physical exam was done as above through visual confirmation on video through MyChart. If it was felt that the patient should be evaluated in the office, they were directed there. The patient verbally consented to this visit. Location of the patient: home Location of the provider: work Those involved with this call:  Provider: Marnee Guarneri, DNP CMA: Irena Reichmann, [place] Desk/Registration: [employer]  Time spent on call:  21 minutes with patient face to face via video conference. More than 50% of this time was spent in counseling and coordination of care. 15 minutes total spent in review of patient's record and preparation of their chart.  I verified patient identity using two factors (patient name and date of birth). Patient consents verbally to being seen via telemedicine visit today.    ANXIETY/STRESS Medication follow-up today.  History of loss of grandson over 2 years ago when she started current regimen.  She reports trying different medications -- Zoloft and some others in past.  Pt made aware of risks of benzo medication use to include increased sedation, respiratory suppression, falls, dependence and cardiovascular events.  Pt would like to continue treatment as benefit determined to outweigh risk.  Taking Xanax two times a day.   Has taken Ambien and Trazodone for insomnia, taken for > 10 years.  Discussed at length risks of long term Ambien use and that when age 58 will need to lower dose to 5 MG.  Last Ambien fill on PDMP review was 11/16/21 (#30 tablets) and Xanax 08/25/21 (#60  tablets).    UDS last performed 08/25/21 and contract signed 05/28/20. Duration:stable Anxious mood: no  Excessive worrying: yes Irritability: no  Sweating: no Nausea: no Palpitations:no Hyperventilation: no Panic attacks: no Agoraphobia: no  Obscessions/compulsions: no Depressed mood: yes Depression screen [facility] 2/9 11/24/2021 08/25/2021 05/20/2021 02/17/2021 11/19/2020  Decreased Interest 0 0 0 0 0  Down, Depressed, Hopeless 1 0 0 0 0  PHQ - 2 Score 1 0 0 0 0  Altered sleeping 1 0 - 0 0  Tired, decreased energy 0 0 - 0 1  Change in appetite 0 0 - 0 -  Feeling bad or failure about yourself  0 0 - 0 0  Trouble concentrating 0 0 - 0 0  Moving slowly or fidgety/restless 0 0 - 0 0  Suicidal thoughts 0 0 - 0 0  PHQ-9 Score 2 0 - 0 1  Difficult doing work/chores Not difficult at all - - - Not difficult at all   GAD 7 : Generalized Anxiety Score 11/24/2021 08/25/2021 05/20/2021 02/17/2021  Nervous, Anxious, on Edge 0 0 0 0  Control/stop worrying 0 0 0 0  Worry too much - different things 0 0 0 0  Trouble relaxing 0 0 0 0  Restless 0 0 0 0  Easily annoyed or irritable 0 0 0 0  Afraid - awful might happen 0 0 0 0  Total GAD 7 Score 0 0 0 0  Anxiety Difficulty Not difficult at all Not difficult at all  Not difficult at all Not difficult at all    Relevant past medical, surgical, family and social history reviewed and updated as indicated. Interim medical history since our last visit reviewed. Allergies and medications reviewed and updated.  Review of Systems  Constitutional:  Negative for activity change, appetite change, diaphoresis, fatigue and fever.  Respiratory:  Negative for cough, chest tightness and shortness of breath.   Cardiovascular:  Negative for chest pain, palpitations and leg swelling.  Gastrointestinal: Negative.   Neurological: Negative.   Psychiatric/Behavioral:  Positive for sleep disturbance. Negative for decreased concentration, self-injury and suicidal ideas. The  patient is nervous/anxious.    Per HPI unless specifically indicated above     Objective:    There were no vitals taken for this visit.  Wt Readings from Last 3 Encounters:  08/25/21 145 lb 3.2 oz (65.9 kg)  05/20/21 148 lb (67.1 kg)  02/17/21 148 lb 6.4 oz (67.3 kg)    Physical Exam Vitals and nursing note reviewed.  Constitutional:      General: She is awake. She is not in acute distress.    Appearance: She is well-developed. She is not ill-appearing.  HENT:     Head: Normocephalic.     Right Ear: Hearing normal.     Left Ear: Hearing normal.  Eyes:     General: Lids are normal.        Right eye: No discharge.        Left eye: No discharge.     Conjunctiva/sclera: Conjunctivae normal.  Pulmonary:     Effort: Pulmonary effort is normal. No accessory muscle usage or respiratory distress.  Musculoskeletal:     Cervical back: Normal range of motion.  Neurological:     Mental Status: She is alert and oriented to person, place, and time.  Psychiatric:        Attention and Perception: Attention normal.        Mood and Affect: Mood normal.        Behavior: Behavior normal. Behavior is cooperative.        Thought Content: Thought content normal.        Judgment: Judgment normal.    Results for orders placed or performed in visit on 08/25/21  [id number] 11+Oxyco+Alc+Crt-Bund  Result Value Ref Range   Ethanol Negative Cutoff=0.020 %   Amphetamines, Urine Negative Cutoff=1000 ng/mL   Barbiturate Negative Cutoff=200 ng/mL   BENZODIAZ UR QL See Final Results Cutoff=200 ng/mL   Cannabinoid Quant, Ur Negative Cutoff=50 ng/mL   Cocaine (Metabolite) Negative Cutoff=300 ng/mL   OPIATE SCREEN URINE Negative Cutoff=300 ng/mL   Oxycodone/Oxymorphone, Urine Negative Cutoff=300 ng/mL   Phencyclidine Negative Cutoff=25 ng/mL   Methadone Screen, Urine Negative Cutoff=300 ng/mL   Propoxyphene Negative Cutoff=300 ng/mL   Meperidine Negative Cutoff=200 ng/mL   Tramadol Negative Cutoff=200  ng/mL   Creatinine 96.7 20.0 - 300.0 mg/dL   pH, Urine 7.8 4.5 - 8.9  Benzodiazepines Confirm, Urine  Result Value Ref Range   Benzodiazepines Positive (A) Cutoff=100 ng/mL   Nordiazepam Negative Cutoff=100   Oxazepam Negative Cutoff=100   Flurazepam Negative Cutoff=100   Lorazepam Negative Cutoff=100   Alprazolam Positive (A)    Alprazolam Conf. 115 Cutoff=100 ng/mL   Clonazepam Negative Cutoff=100   Temazepam Negative Cutoff=100   Triazolam Negative Cutoff=100   Midazolam Negative Cutoff=100      Assessment & Plan:   Problem List Items Addressed This Visit       Other   Generalized anxiety disorder - Primary  Chronic, ongoing since loss of grandson > 3 years ago.  Denies SI/HI.  Had at length discussion on risks of long term benzo use, she is aware in future would benefit reductions. Recommend we do this in small increments -- start out by discontinuing evening Xanax dose every other day and then work slowly towards discontinuing dose every evening -- only morning dose taken, then if tolerating will continue to reduce further and work towards discontinuation.  UDS up to date.  Refills sent.  Return in 3 months.      Relevant Medications   ALPRAZolam (XANAX) 0.25 MG tablet   Insomnia    Chronic, ongoing for several years.  Continue current medication regimen to include Ambien and Trazodone, discussed at length risks of long term Ambien use and that with age will need to reduce dose.  UDS up to date and controlled substance contract up to date.  Recommend continued focus on healthy sleep hygiene techniques. Refill up to date. Return in 3 months.      Long term prescription benzodiazepine use    Refer to anxiety plan of care, UDS today.       I discussed the assessment and treatment plan with the patient. The patient was provided an opportunity to ask questions and all were answered. The patient agreed with the plan and demonstrated an understanding of the  instructions.   The patient was advised to call back or seek an in-person evaluation if the symptoms worsen or if the condition fails to improve as anticipated.   I provided 21+ minutes of time during this encounter.   Follow up plan: Return in about 3 months (around 02/22/2022) for [place].

## 2021-11-24 NOTE — Patient Instructions (Signed)
Managing Anxiety, Adult ?After being diagnosed with anxiety, you may be relieved to know why you have felt or behaved a certain way. You may also feel overwhelmed about the treatment ahead and what it will mean for your life. With care and support, you can manage this condition. ?How to manage lifestyle changes ?Managing stress and anxiety ?Stress is your body's reaction to life changes and events, both good and bad. Most stress will last just a few hours, but stress can be ongoing and can lead to more than just stress. Although stress can play a major role in anxiety, it is not the same as anxiety. Stress is usually caused by something external, such as a deadline, test, or competition. Stress normally passes after the triggering event has ended.  ?Anxiety is caused by something internal, such as imagining a terrible outcome or worrying that something will go wrong that will devastate you. Anxiety often does not go away even after the triggering event is over, and it can become long-term (chronic) worry. It is important to understand the differences between stress and anxiety and to manage your stress effectively so that it does not lead to an anxious response. ?Talk with your health care provider or a counselor to learn more about reducing anxiety and stress. He or she may suggest tension reduction techniques, such as: ?Music therapy. Spend time creating or listening to music that you enjoy and that inspires you. ?Mindfulness-based meditation. Practice being aware of your normal breaths while not trying to control your breathing. It can be done while sitting or walking. ?Centering prayer. This involves focusing on a word, phrase, or sacred image that means something to you and brings you peace. ?Deep breathing. To do this, expand your stomach and inhale slowly through your nose. Hold your breath for 3-5 seconds. Then exhale slowly, letting your stomach muscles relax. ?Self-talk. Learn to notice and identify  thought patterns that lead to anxiety reactions and change those patterns to thoughts that feel peaceful. ?Muscle relaxation. Taking time to tense muscles and then relax them. ?Choose a tension reduction technique that fits your lifestyle and personality. These techniques take time and practice. Set aside 5-15 minutes a day to do them. Therapists can offer counseling and training in these techniques. The training to help with anxiety may be covered by some insurance plans. ?Other things you can do to manage stress and anxiety include: ?Keeping a stress diary. This can help you learn what triggers your reaction and then learn ways to manage your response. ?Thinking about how you react to certain situations. You may not be able to control everything, but you can control your response. ?Making time for activities that help you relax and not feeling guilty about spending your time in this way. ?Doing visual imagery. This involves imagining or creating mental pictures to help you relax. ?Practicing yoga. Through yoga poses, you can lower tension and promote relaxation. ? ?Medicines ?Medicines can help ease symptoms. Medicines for anxiety include: ?Antidepressant medicines. These are usually prescribed for long-term daily control. ?Anti-anxiety medicines. These may be added in severe cases, especially when panic attacks occur. ?Medicines will be prescribed by a health care provider. When used together, medicines, psychotherapy, and tension reduction techniques may be the most effective treatment. ?Relationships ?Relationships can play a big part in helping you recover. Try to spend more time connecting with trusted friends and family members. ?Consider going to couples counseling if you have a partner, taking family education classes, or going to family   therapy. ?Therapy can help you and others better understand your condition. ?How to recognize changes in your anxiety ?Everyone responds differently to treatment for  anxiety. Recovery from anxiety happens when symptoms decrease and stop interfering with your daily activities at home or work. This may mean that you will start to: ?Have better concentration and focus. Worry will interfere less in your daily thinking. ?Sleep better. ?Be less irritable. ?Have more energy. ?Have improved memory. ?It is also important to recognize when your condition is getting worse. Contact your health care provider if your symptoms interfere with home or work and you feel like your condition is not improving. ?Follow these instructions at home: ?Activity ?Exercise. Adults should do the following: ?Exercise for at least 150 minutes each week. The exercise should increase your heart rate and make you sweat (moderate-intensity exercise). ?Strengthening exercises at least twice a week. ?Get the right amount and quality of sleep. Most adults need 7-9 hours of sleep each night. ?Lifestyle ? ?Eat a healthy diet that includes plenty of vegetables, fruits, whole grains, low-fat dairy products, and lean protein. ?Do not eat a lot of foods that are high in fats, added sugars, or salt (sodium). ?Make choices that simplify your life. ?Do not use any products that contain nicotine or tobacco. These products include cigarettes, chewing tobacco, and vaping devices, such as e-cigarettes. If you need help quitting, ask your health care provider. ?Avoid caffeine, alcohol, and certain over-the-counter cold medicines. These may make you feel worse. Ask your pharmacist which medicines to avoid. ?General instructions ?Take over-the-counter and prescription medicines only as told by your health care provider. ?Keep all follow-up visits. This is important. ?Where to find support ?You can get help and support from these sources: ?Self-help groups. ?Online and community organizations. ?A trusted spiritual leader. ?Couples counseling. ?Family education classes. ?Family therapy. ?Where to find more information ?You may find  that joining a support group helps you deal with your anxiety. The following sources can help you locate counselors or support groups near you: ?Mental Health America: www.mentalhealthamerica.net ?Anxiety and Depression Association of America (ADAA): www.adaa.org ?National Alliance on Mental Illness (NAMI): www.nami.org ?Contact a health care provider if: ?You have a hard time staying focused or finishing daily tasks. ?You spend many hours a day feeling worried about everyday life. ?You become exhausted by worry. ?You start to have headaches or frequently feel tense. ?You develop chronic nausea or diarrhea. ?Get help right away if: ?You have a racing heart and shortness of breath. ?You have thoughts of hurting yourself or others. ?If you ever feel like you may hurt yourself or others, or have thoughts about taking your own life, get help right away. Go to your nearest emergency department or: ?Call your local emergency services (911 in the [place] ?Call a suicide crisis helpline, such as the National Suicide Prevention Lifeline at 1-800-273-8255 or 988 in the [place] This is open 24 hours a day in the [place] ?Text the Crisis Text Line at 741741 (in the [place] ?Summary ?Taking steps to learn and use tension reduction techniques can help calm you and help prevent triggering an anxiety reaction. ?When used together, medicines, psychotherapy, and tension reduction techniques may be the most effective treatment. ?Family, friends, and partners can play a big part in supporting you. ?This information is not intended to replace advice given to you by your health care provider. Make sure you discuss any questions you have with your health care provider. ?Document Revised: 06/17/2021 Document Reviewed: 03/15/2021 ?Elsevier Patient   Education ? 2022 Elsevier Inc. ? ?

## 2021-11-24 NOTE — Assessment & Plan Note (Signed)
Chronic, ongoing since loss of grandson > 3 years ago.  Denies SI/HI.  Had at length discussion on risks of long term benzo use, she is aware in future would benefit reductions. Recommend we do this in small increments -- start out by discontinuing evening Xanax dose every other day and then work slowly towards discontinuing dose every evening -- only morning dose taken, then if tolerating will continue to reduce further and work towards discontinuation.  UDS up to date.  Refills sent.  Return in 3 months.

## 2022-02-10 ENCOUNTER — Other Ambulatory Visit: Payer: Self-pay | Admitting: Nurse Practitioner

## 2022-02-10 NOTE — Telephone Encounter (Signed)
Requested medication (s) are due for refill today: due 02/14/22 ? ?Requested medication (s) are on the active medication list: yes   ? ?Last refill: 11/16/22  #90  0 refills ? ?Future visit scheduled yes 02/23/22 ? ?Notes to clinic:Not delegated ? ?Requested Prescriptions  ?Pending Prescriptions Disp Refills  ? zolpidem (AMBIEN) 10 MG tablet [Pharmacy Med Name: ZOLPIDEM TARTRATE 10 MG TABLET] 90 tablet   ?  Sig: TAKE ONE TABLET BY MOUTH AT BEDTIME  ?  ? Not Delegated - Psychiatry:  Anxiolytics/Hypnotics Failed - 02/10/2022  7:17 AM  ?  ?  Failed - This refill cannot be delegated  ?  ?  Passed - Urine Drug Screen completed in last 360 days  ?  ?  Passed - Valid encounter within last 6 months  ?  Recent Outpatient Visits   ? ?      ? 2 months ago Generalized anxiety disorder  ? [place], Dauphin T, NP  ? 5 months ago Generalized anxiety disorder  ? [place], Aurora T, NP  ? 8 months ago Essential hypertension  ? [facility] [place], Markham T, NP  ? 11 months ago Essential hypertension  ? [place], Henrine Screws T, NP  ? 1 year ago Essential hypertension  ? [facility] [place], Henrine Screws T, NP  ? ?  ?  ?Future Appointments   ? ?        ? In 1 week Cannady, Barbaraann Faster, NP [employer]  ? ?  ? ?  ?  ?  ? ? ? ? ?

## 2022-02-17 ENCOUNTER — Other Ambulatory Visit: Payer: Self-pay | Admitting: Nurse Practitioner

## 2022-02-17 NOTE — Telephone Encounter (Signed)
zolpidem (AMBIEN) 10 MG tablet 90 tablet 0 02/10/2022    ?Sig: TAKE ONE TABLET BY MOUTH AT BEDTIME   ?Sent to pharmacy as: zolpidem (AMBIEN) 10 MG tablet   ?E-Prescribing Status: Receipt confirmed by pharmacy (02/10/2022  4:29 PM EST)   ?Pt states that [facility] states they never got this script and in fact told her that they had never filled it before and all and she takes has taken for years. Please rend as pt is almost out .  ? ?[facility] [id number], [place]  ?[place] [address]  ?Phone: [phone] Fax: [phone]  ? ?

## 2022-02-17 NOTE — Telephone Encounter (Signed)
Requested medications are due for refill today.  yes ? ?Requested medications are on the active medications list.  yes ? ?Last refill. 02/10/2022 ? ?Future visit scheduled.   yes ? ?Notes to clinic.  Medication not delegated.  ? ?Matilde Sprang, RN  [facility] Nurse Triage Pool 2 hours ago (10:39 AM)  ? ?Resend, pharmacy did not receive   ? ? ? ? ?Requested Prescriptions  ?Pending Prescriptions Disp Refills  ? zolpidem (AMBIEN) 10 MG tablet 90 tablet 0  ?  Sig: Take 1 tablet (10 mg total) by mouth at bedtime.  ?  ? Not Delegated - Psychiatry:  Anxiolytics/Hypnotics Failed - 02/17/2022 10:39 AM  ?  ?  Failed - This refill cannot be delegated  ?  ?  Passed - Urine Drug Screen completed in last 360 days  ?  ?  Passed - Valid encounter within last 6 months  ?  Recent Outpatient Visits   ? ?      ? 2 months ago Generalized anxiety disorder  ? [place], Reinholds T, NP  ? 5 months ago Generalized anxiety disorder  ? [facility] [place], Suarez T, NP  ? 9 months ago Essential hypertension  ? [place], Henrine Screws T, NP  ? 1 year ago Essential hypertension  ? [place], Henrine Screws T, NP  ? 1 year ago Essential hypertension  ? [facility] [place], Henrine Screws T, NP  ? ?  ?  ?Future Appointments   ? ?        ? In 6 days Cannady, Barbaraann Faster, NP [facility] [facility]  ? ?  ? ?  ?  ?  ?  ?

## 2022-02-20 NOTE — Patient Instructions (Signed)

## 2022-02-23 ENCOUNTER — Ambulatory Visit (INDEPENDENT_AMBULATORY_CARE_PROVIDER_SITE_OTHER): Payer: 59 | Admitting: Nurse Practitioner

## 2022-02-23 ENCOUNTER — Other Ambulatory Visit: Payer: Self-pay

## 2022-02-23 ENCOUNTER — Encounter: Payer: Self-pay | Admitting: Nurse Practitioner

## 2022-02-23 VITALS — BP 122/81 | HR 66 | Temp 98.6°F | Wt 143.8 lb

## 2022-02-23 DIAGNOSIS — F5104 Psychophysiologic insomnia: Secondary | ICD-10-CM | POA: Diagnosis not present

## 2022-02-23 DIAGNOSIS — F411 Generalized anxiety disorder: Secondary | ICD-10-CM

## 2022-02-23 DIAGNOSIS — R69 Illness, unspecified: Secondary | ICD-10-CM | POA: Diagnosis not present

## 2022-02-23 DIAGNOSIS — Z79899 Other long term (current) drug therapy: Secondary | ICD-10-CM

## 2022-02-23 MED ORDER — ALPRAZOLAM 0.25 MG PO TABS: 0.2500 mg | ORAL_TABLET | Freq: Two times a day (BID) | ORAL | 0 refills | Status: DC

## 2022-02-23 NOTE — Progress Notes (Signed)
? ?BP 122/81   Pulse 66   Temp 98.6 ?F (37 ?C) (Oral)   Wt 143 lb 12.8 oz (65.2 kg)   SpO2 97%   BMI 22.52 kg/m?   ? ?Subjective:  ? ? Patient ID: Lori May, female    DOB: 16-Mar-1961, 61 y.o.   MRN: [id number] ? ?HPI: ?Lori May is a 61 y.o. female ? ?Chief Complaint  ?Patient presents with  ? Insomnia  ? Mood  ? ?ANXIETY/STRESS ?Medication follow-up today.  History of loss of grandson over 2 years ago when she started current regimen.  Taking Zoloft 50 MG daily + Xanax two times a day. ? ?Pt made aware of risks of benzo medication use to include increased sedation, respiratory suppression, falls, dependence and cardiovascular events.  Pt would like to continue treatment as benefit determined to outweigh risk.   ?  ?Has taken Ambien and Trazodone for insomnia, taken for > 10 years.  Discussed at length risks of long term Ambien use and that when age 70 will need to lower dose to 5 MG.  Last Ambien fill on PDMP review was 02/20/22 (#30 tablets) and Xanax 11/24/21 (#60 tablets).   ? ?UDS last performed 08/25/21 and contract signed 05/28/20. ?Duration:stable ?Anxious mood: no  ?Excessive worrying: yes ?Irritability: no  ?Sweating: no ?Nausea: no ?Palpitations:no ?Hyperventilation: no ?Panic attacks: no ?Agoraphobia: no  ?Obscessions/compulsions: no ?Depressed mood: yes ?Depression screen [facility] 2/9 02/23/2022 11/24/2021 08/25/2021 05/20/2021 02/17/2021  ?Decreased Interest 0 0 0 0 0  ?Down, Depressed, Hopeless 0 1 0 0 0  ?PHQ - 2 Score 0 1 0 0 0  ?Altered sleeping 0 1 0 - 0  ?Tired, decreased energy 0 0 0 - 0  ?Change in appetite 0 0 0 - 0  ?Feeling bad or failure about yourself  0 0 0 - 0  ?Trouble concentrating 0 0 0 - 0  ?Moving slowly or fidgety/restless 0 0 0 - 0  ?Suicidal thoughts 0 0 0 - 0  ?PHQ-9 Score 0 2 0 - 0  ?Difficult doing work/chores - Not difficult at all - - -  ? ?GAD 7 : Generalized Anxiety Score 02/23/2022 11/24/2021 08/25/2021 05/20/2021  ?Nervous, Anxious, on Edge 0 0 0 0  ?Control/stop worrying 0  0 0 0  ?Worry too much - different things 0 0 0 0  ?Trouble relaxing 0 0 0 0  ?Restless 0 0 0 0  ?Easily annoyed or irritable 0 0 0 0  ?Afraid - awful might happen 0 0 0 0  ?Total GAD 7 Score 0 0 0 0  ?Anxiety Difficulty - Not difficult at all Not difficult at all Not difficult at all  ? ? ?Relevant past medical, surgical, family and social history reviewed and updated as indicated. Interim medical history since our last visit reviewed. ?Allergies and medications reviewed and updated. ? ?Review of Systems  ?Constitutional:  Negative for activity change, appetite change, diaphoresis, fatigue and fever.  ?Respiratory:  Negative for cough, chest tightness and shortness of breath.   ?Cardiovascular:  Negative for chest pain, palpitations and leg swelling.  ?Gastrointestinal: Negative.   ?Neurological: Negative.   ?Psychiatric/Behavioral:  Positive for sleep disturbance. Negative for decreased concentration, self-injury and suicidal ideas. The patient is nervous/anxious.   ? ?Per HPI unless specifically indicated above ? ?   ?Objective:  ?  ?BP 122/81   Pulse 66   Temp 98.6 ?F (37 ?C) (Oral)   Wt 143 lb 12.8 oz (65.2 kg)  SpO2 97%   BMI 22.52 kg/m?   ?Wt Readings from Last 3 Encounters:  ?02/23/22 143 lb 12.8 oz (65.2 kg)  ?08/25/21 145 lb 3.2 oz (65.9 kg)  ?05/20/21 148 lb (67.1 kg)  ?  ?Physical Exam ?Vitals and nursing note reviewed.  ?Constitutional:   ?   General: She is awake. She is not in acute distress. ?   Appearance: She is well-developed, well-groomed and overweight. She is not ill-appearing.  ?HENT:  ?   Head: Normocephalic.  ?   Right Ear: Hearing normal.  ?   Left Ear: Hearing normal.  ?Eyes:  ?   General: Lids are normal.     ?   Right eye: No discharge.     ?   Left eye: No discharge.  ?   Conjunctiva/sclera: Conjunctivae normal.  ?   Pupils: Pupils are equal, round, and reactive to light.  ?Neck:  ?   Thyroid: No thyromegaly.  ?   Vascular: No carotid bruit.  ?Cardiovascular:  ?   Rate and Rhythm:  Normal rate and regular rhythm.  ?   Heart sounds: Normal heart sounds. No murmur heard. ?  No gallop.  ?Pulmonary:  ?   Effort: Pulmonary effort is normal. No accessory muscle usage or respiratory distress.  ?   Breath sounds: Normal breath sounds.  ?Abdominal:  ?   General: Bowel sounds are normal.  ?   Palpations: Abdomen is soft.  ?Musculoskeletal:  ?   Cervical back: Normal range of motion and neck supple.  ?   Right lower leg: No edema.  ?   Left lower leg: No edema.  ?Lymphadenopathy:  ?   Cervical: No cervical adenopathy.  ?Skin: ?   General: Skin is warm and dry.  ?Neurological:  ?   Mental Status: She is alert and oriented to person, place, and time.  ?Psychiatric:     ?   Attention and Perception: Attention normal.     ?   Mood and Affect: Mood normal.     ?   Speech: Speech normal.     ?   Behavior: Behavior normal. Behavior is cooperative.     ?   Thought Content: Thought content normal.  ? ? ?Results for orders placed or performed in visit on 08/25/21  ?[id number] 11+Oxyco+Alc+Crt-Bund  ?Result Value Ref Range  ? Ethanol Negative Cutoff=0.020 %  ? Amphetamines, Urine Negative Cutoff=1000 ng/mL  ? Barbiturate Negative Cutoff=200 ng/mL  ? BENZODIAZ UR QL See Final Results Cutoff=200 ng/mL  ? Cannabinoid Quant, Ur Negative Cutoff=50 ng/mL  ? Cocaine (Metabolite) Negative Cutoff=300 ng/mL  ? OPIATE SCREEN URINE Negative Cutoff=300 ng/mL  ? Oxycodone/Oxymorphone, Urine Negative Cutoff=300 ng/mL  ? Phencyclidine Negative Cutoff=25 ng/mL  ? Methadone Screen, Urine Negative Cutoff=300 ng/mL  ? Propoxyphene Negative Cutoff=300 ng/mL  ? Meperidine Negative Cutoff=200 ng/mL  ? Tramadol Negative Cutoff=200 ng/mL  ? Creatinine 96.7 20.0 - 300.0 mg/dL  ? pH, Urine 7.8 4.5 - 8.9  ?Benzodiazepines Confirm, Urine  ?Result Value Ref Range  ? Benzodiazepines Positive (A) Cutoff=100 ng/mL  ? Nordiazepam Negative Cutoff=100  ? Oxazepam Negative Cutoff=100  ? Flurazepam Negative Cutoff=100  ? Lorazepam Negative Cutoff=100  ?  Alprazolam Positive (A)   ? Alprazolam Conf. 115 Cutoff=100 ng/mL  ? Clonazepam Negative Cutoff=100  ? Temazepam Negative Cutoff=100  ? Triazolam Negative Cutoff=100  ? Midazolam Negative Cutoff=100  ? ?   ?Assessment & Plan:  ? ?Problem List Items Addressed This Visit   ? ?  ?  Other  ? Generalized anxiety disorder - Primary  ?  Chronic, ongoing since loss of grandson > 3 years ago.  Denies SI/HI.  Continue Zoloft daily.  Had at length discussion on risks of long term benzo use, she is aware in future would benefit reductions. Recommend we do this in small increments -- start out by discontinuing evening Xanax dose every other day and then work slowly towards discontinuing dose every evening -- only morning dose taken, then if tolerating will continue to reduce further and work towards discontinuation.  UDS up to date.  Refills sent.  Return in 3 months. ?  ?  ? Relevant Medications  ? ALPRAZolam (XANAX) 0.25 MG tablet  ? Insomnia  ?  Chronic, ongoing for several years.  Continue current medication regimen to include Ambien and Trazodone, discussed at length risks of long term Ambien use and that with age will need to reduce dose.  UDS up to date and controlled substance contract up to date.  Recommend continued focus on healthy sleep hygiene techniques. Refill up to date. Return in 3 months. ?  ?  ? Long term prescription benzodiazepine use  ?  Refer to anxiety plan of care, UDS up to date. ?  ?  ?  ? ?Follow up plan: ?Return in about 3 months (around 05/21/2022) for Annual physical due after 05/20/22. ?

## 2022-02-23 NOTE — Assessment & Plan Note (Signed)
Chronic, ongoing since loss of grandson > 3 years ago.  Denies SI/HI.  Continue Zoloft daily.  Had at length discussion on risks of long term benzo use, she is aware in future would benefit reductions. Recommend we do this in small increments -- start out by discontinuing evening Xanax dose every other day and then work slowly towards discontinuing dose every evening -- only morning dose taken, then if tolerating will continue to reduce further and work towards discontinuation.  UDS up to date.  Refills sent.  Return in 3 months. ?

## 2022-02-23 NOTE — Assessment & Plan Note (Signed)
Chronic, ongoing for several years.  Continue current medication regimen to include Ambien and Trazodone, discussed at length risks of long term Ambien use and that with age will need to reduce dose.  UDS up to date and controlled substance contract up to date.  Recommend continued focus on healthy sleep hygiene techniques. Refill up to date. Return in 3 months. ?

## 2022-02-23 NOTE — Assessment & Plan Note (Signed)
Refer to anxiety plan of care, UDS up to date. ?

## 2022-05-12 ENCOUNTER — Other Ambulatory Visit: Payer: Self-pay | Admitting: Nurse Practitioner

## 2022-05-12 NOTE — Telephone Encounter (Signed)
Requested medication (s) are due for refill today - yes  Requested medication (s) are on the active medication list -yes  Future visit scheduled -yes  Last refill: 02/10/22 #90  Notes to clinic: non delegated Rx  Requested Prescriptions  Pending Prescriptions Disp Refills   zolpidem (AMBIEN) 10 MG tablet [Pharmacy Med Name: ZOLPIDEM TARTRATE 10 MG TABLET] 90 tablet     Sig: TAKE ONE TABLET BY MOUTH AT BEDTIME     Not Delegated - Psychiatry:  Anxiolytics/Hypnotics Failed - 05/12/2022  8:24 AM      Failed - This refill cannot be delegated      Passed - Urine Drug Screen completed in last 360 days      Passed - Valid encounter within last 6 months    Recent Outpatient Visits           2 months ago Generalized anxiety disorder   [place], Henrine Screws T, NP   5 months ago Generalized anxiety disorder   [place], [place] T, NP   8 months ago Generalized anxiety disorder   [place], Barbaraann Faster, NP   11 months ago Essential hypertension   [place], [place] T, NP   1 year ago Essential hypertension   [place], Barbaraann Faster, NP       Future Appointments             In 2 weeks [place], Barbaraann Faster, NP [facility]                Requested Prescriptions  Pending Prescriptions Disp Refills   zolpidem (AMBIEN) 10 MG tablet [Pharmacy Med Name: ZOLPIDEM TARTRATE 10 MG TABLET] 90 tablet     Sig: TAKE ONE TABLET BY MOUTH AT BEDTIME     Not Delegated - Psychiatry:  Anxiolytics/Hypnotics Failed - 05/12/2022  8:24 AM      Failed - This refill cannot be delegated      Passed - Urine Drug Screen completed in last 360 days      Passed - Valid encounter within last 6 months    Recent Outpatient Visits           2 months ago Generalized anxiety disorder   [place], Henrine Screws T, NP   5 months ago Generalized anxiety disorder   [place], [place] T, NP   8 months ago Generalized anxiety disorder   [place], Barbaraann Faster, NP   11 months ago Essential hypertension   [place], [place] T, NP   1 year ago Essential hypertension   [place], Barbaraann Faster, NP       Future Appointments             In 2 weeks Cannady, Barbaraann Faster, NP [facility]

## 2022-06-01 ENCOUNTER — Encounter: Payer: Self-pay | Admitting: Nurse Practitioner

## 2022-06-01 ENCOUNTER — Ambulatory Visit (INDEPENDENT_AMBULATORY_CARE_PROVIDER_SITE_OTHER): Payer: 59 | Admitting: Nurse Practitioner

## 2022-06-01 VITALS — BP 133/85 | HR 71 | Temp 98.6°F | Ht 67.25 in | Wt 137.2 lb

## 2022-06-01 DIAGNOSIS — Z79899 Other long term (current) drug therapy: Secondary | ICD-10-CM

## 2022-06-01 DIAGNOSIS — F5104 Psychophysiologic insomnia: Secondary | ICD-10-CM

## 2022-06-01 DIAGNOSIS — Z0000 Encounter for general adult medical examination without abnormal findings: Secondary | ICD-10-CM | POA: Diagnosis not present

## 2022-06-01 DIAGNOSIS — I10 Essential (primary) hypertension: Secondary | ICD-10-CM

## 2022-06-01 DIAGNOSIS — E7800 Pure hypercholesterolemia, unspecified: Secondary | ICD-10-CM | POA: Diagnosis not present

## 2022-06-01 DIAGNOSIS — Z853 Personal history of malignant neoplasm of breast: Secondary | ICD-10-CM | POA: Diagnosis not present

## 2022-06-01 DIAGNOSIS — F411 Generalized anxiety disorder: Secondary | ICD-10-CM | POA: Diagnosis not present

## 2022-06-01 DIAGNOSIS — R69 Illness, unspecified: Secondary | ICD-10-CM | POA: Diagnosis not present

## 2022-06-01 MED ORDER — LISINOPRIL 10 MG PO TABS
10.0000 mg | ORAL_TABLET | Freq: Every morning | ORAL | 4 refills | Status: DC
Start: 2022-06-01 — End: 2022-11-24

## 2022-06-01 MED ORDER — ALPRAZOLAM 0.25 MG PO TABS
0.2500 mg | ORAL_TABLET | Freq: Two times a day (BID) | ORAL | 0 refills | Status: DC
Start: 2022-06-01 — End: 2022-08-25

## 2022-06-01 MED ORDER — SERTRALINE HCL 50 MG PO TABS: 50.0000 mg | ORAL_TABLET | Freq: Every day | ORAL | 4 refills | Status: DC

## 2022-06-01 MED ORDER — TRAZODONE HCL 100 MG PO TABS
100.0000 mg | ORAL_TABLET | Freq: Every day | ORAL | 4 refills | Status: DC
Start: 2022-06-01 — End: 2022-11-24

## 2022-06-01 MED ORDER — DICYCLOMINE HCL 10 MG PO CAPS
10.0000 mg | ORAL_CAPSULE | Freq: Three times a day (TID) | ORAL | 12 refills | Status: DC
Start: 2022-06-01 — End: 2022-11-24

## 2022-06-01 NOTE — Assessment & Plan Note (Signed)
Breast exam done today. Mammogram ordered.

## 2022-06-01 NOTE — Assessment & Plan Note (Signed)
Refer to anxiety plan of care. 

## 2022-06-01 NOTE — Progress Notes (Deleted)
BP 133/85   Pulse 71   Temp 98.6 F (37 C) (Oral)   Ht 5' 7.25" (1.708 m)   Wt 137 lb 3.2 oz (62.2 kg)   SpO2 98%   BMI 21.33 kg/m    Subjective:    Patient ID: Lori May, female    DOB: 18-Apr-1961, 61 y.o.   MRN: [id number]  HPI: Lori May is a 61 y.o. female presenting on 06/01/2022 for comprehensive medical examination. Current medical complaints include:none  She currently lives with: husband Menopausal Symptoms: no   HYPERTENSION Currently taking HCTZ and Lisinopril daily.  Hypertension status: {Blank single:19197::"controlled","uncontrolled","better","worse","exacerbated","stable"}  Satisfied with current treatment? {Blank single:19197::"yes","no"} Duration of hypertension: {Blank single:19197::"chronic","months","years"} BP monitoring frequency:  {Blank single:19197::"not checking","rarely","daily","weekly","monthly","a few times a day","a few times a week","a few times a month"} BP range:  BP medication side effects:  {Blank single:19197::"yes","no"} Medication compliance: {Blank single:19197::"excellent compliance","good compliance","fair compliance","poor compliance"} Aspirin: {Blank single:19197::"yes","no"} Recurrent headaches: {Blank single:19197::"yes","no"} Visual changes: {Blank single:19197::"yes","no"} Palpitations: {Blank single:19197::"yes","no"} Dyspnea: {Blank single:19197::"yes","no"} Chest pain: {Blank single:19197::"yes","no"} Lower extremity edema: {Blank single:19197::"yes","no"} Dizzy/lightheaded: {Blank single:19197::"yes","no"}  The 10-year ASCVD risk score (Arnett DK, et al., 2019) is: 9.8%   Values used to calculate the score:     Age: 61 years     Sex: Female     Is Non-Hispanic African American: No     Diabetic: No     Tobacco smoker: Yes     Systolic Blood Pressure: 133 mmHg     Is BP treated: Yes     HDL Cholesterol: 83 mg/dL     Total Cholesterol: 223 mg/dL  ANXIETY/STRESS Lost a grandson >3 years ago at young age to brain  tumor, that is when she was placed on Xanax.  Tried different medications -- Zoloft and some others in past.  Pt made aware of risks of benzo medication use to include increased sedation, respiratory suppression, falls, dependence and cardiovascular events.  Pt would like to continue treatment as benefit determined to outweigh risk.  Currently taking Zoloft 50 MG daily + Xanax.   Has taken Ambien and Trazodone for insomnia, taken for > 10 years.  Discussed at length risks of long term Ambien use and that when age 61 will need to lower dose to 5 MG.  Last Ambien fill on PDMP review was 05/18/22 (#90 tablets) and Xanax 02/23/22 (#120 tablets).  She is interested in tapering off Xanax, this has been discussed past visits. Mood status: {Blank single:19197::"controlled","uncontrolled","better","worse","exacerbated","stable"} Satisfied with current treatment?: {Blank single:19197::"yes","no"} Symptom severity: {Blank single:19197::"mild","moderate","severe"}  Duration of current treatment : {Blank single:19197::"chronic","months","years"} Side effects: {Blank single:19197::"yes","no"} Medication compliance: {Blank single:19197::"excellent compliance","good compliance","fair compliance","poor compliance"} Psychotherapy/counseling: {Blank single:19197::"yes","no"} {Blank single:19197::"current","in the past"} Previous psychiatric medications: multiple medications Depressed mood: {Blank single:19197::"yes","no"} Anxious mood: {Blank single:19197::"yes","no"} Anhedonia: {Blank single:19197::"yes","no"} Significant weight loss or gain: {Blank single:19197::"yes","no"} Insomnia: {Blank single:19197::"yes","no"} {Blank single:19197::"hard to fall asleep","hard to stay asleep"} Fatigue: {Blank single:19197::"yes","no"} Feelings of worthlessness or guilt: {Blank single:19197::"yes","no"} Impaired concentration/indecisiveness: {Blank single:19197::"yes","no"} Suicidal ideations: {Blank  single:19197::"yes","no"} Hopelessness: {Blank single:19197::"yes","no"} Crying spells: {Blank single:19197::"yes","no"}    06/01/2022   10:10 AM 02/23/2022    3:54 PM 11/24/2021    3:59 PM 08/25/2021    3:47 PM 05/20/2021    3:11 PM  Depression screen PHQ 2/9  Decreased Interest 1 0 0 0 0  Down, Depressed, Hopeless 1 0 1 0 0  PHQ - 2 Score 2 0 1 0 0  Altered sleeping 0 0 1 0   Tired, decreased energy 1 0 0 0  Change in appetite 0 0 0 0   Feeling bad or failure about yourself  0 0 0 0   Trouble concentrating 0 0 0 0   Moving slowly or fidgety/restless 0 0 0 0   Suicidal thoughts 0 0 0 0   PHQ-9 Score 3 0 2 0   Difficult doing work/chores Not difficult at all  Not difficult at all         06/01/2022   10:11 AM 02/23/2022    3:55 PM 11/24/2021    3:59 PM 08/25/2021    3:47 PM  GAD 7 : Generalized Anxiety Score  Nervous, Anxious, on Edge 1 0 0 0  Control/stop worrying 1 0 0 0  Worry too much - different things 1 0 0 0  Trouble relaxing 0 0 0 0  Restless 0 0 0 0  Easily annoyed or irritable 0 0 0 0  Afraid - awful might happen 1 0 0 0  Total GAD 7 Score 4 0 0 0  Anxiety Difficulty Somewhat difficult  Not difficult at all Not difficult at all      The patient does not have a history of falls. I did not complete a risk assessment for falls. A plan of care for falls was not documented.   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   Cancer (HCC) 2014   right breast LCIS   Hypertension    Postmenopausal 2013    Surgical History:  Past Surgical History:  Procedure Laterality Date   BREAST BIOPSY Left 10/06/2015   ADH   BREAST EXCISIONAL BIOPSY Left 01/2016   lumpectomy to remove ADH   BREAST EXCISIONAL BIOPSY Right 10/26/2013   lumpectomy breast lobular carcinoma    BREAST LUMPECTOMY Right 2014   LCIS   BREAST LUMPECTOMY WITH NEEDLE LOCALIZATION Left 01/07/2016   Procedure:  BREAST LUMPECTOMY WITH NEEDLE LOCALIZATION;  Surgeon: Gladis Riffle, MD;  Location: [facility] ORS;  Service: General;  Laterality: Left;   BREAST SURGERY Right 10/26/2013   Partial Mastectomy   NOVASURE ABLATION  2007    Medications:  Current Outpatient Medications on File Prior to Visit  Medication Sig   acetaminophen (TYLENOL) 500 MG tablet Take 500 mg by mouth as needed.   ALPRAZolam (XANAX) 0.25 MG tablet Take 1 tablet (0.25 mg total) by mouth 2 (two) times daily.   dicyclomine (BENTYL) 10 MG capsule Take 10 mg by mouth 3 (three) times daily before meals.   lisinopril (ZESTRIL) 10 MG tablet Take 1 tablet (10 mg total) by mouth every morning.   sertraline (ZOLOFT) 50 MG tablet Take 1 tablet (50 mg total) by mouth daily.   traZODone (DESYREL) 100 MG tablet Take 1 tablet (100 mg total) by mouth at bedtime.   zolpidem (AMBIEN) 10 MG tablet TAKE ONE TABLET BY MOUTH AT BEDTIME   No current facility-administered medications on file prior to visit.    Allergies:  No Known Allergies  Social History:  Social History   Socioeconomic History   Marital status: Married    Spouse name: Not on file   Number of children: Not on file   Years of education: Not on file   Highest education level: Not on file  Occupational History   Not on file  Tobacco Use   Smoking status: Former    Packs/day: 0.50    Years: 0.00  Total pack years: 0.00    Types: Cigarettes    Quit date: 06/09/2016    Years since quitting: 5.9   Smokeless tobacco: Never   Tobacco comments:    ocassional smoker -- social smoker  Vaping Use   Vaping Use: Never used  Substance and Sexual Activity   Alcohol use: Yes    Alcohol/week: 0.0 standard drinks of alcohol    Comment: wine OCC   Drug use: No   Sexual activity: Yes  Other Topics Concern   Not on file  Social History Narrative   Not on file   Social Determinants of Health   Financial Resource Strain: Low Risk  (05/20/2021)   Overall Financial Resource  Strain (CARDIA)    Difficulty of Paying Living Expenses: Not hard at all  Food Insecurity: No Food Insecurity (05/20/2021)   Hunger Vital Sign    Worried About Running Out of Food in the Last Year: Never true    Ran Out of Food in the Last Year: Never true  Transportation Needs: No Transportation Needs (05/20/2021)   PRAPARE - Administrator, Civil Service (Medical): No    Lack of Transportation (Non-Medical): No  Physical Activity: Sufficiently Active (05/20/2021)   Exercise Vital Sign    Days of Exercise per Week: 4 days    Minutes of Exercise per Session: 40 min  Stress: No Stress Concern Present (05/20/2021)   [employer] of Occupational Health - Occupational Stress Questionnaire    Feeling of Stress : Only a little  Social Connections: Moderately Isolated (05/20/2021)   Social Connection and Isolation Panel [NHANES]    Frequency of Communication with Friends and Family: More than three times a week    Frequency of Social Gatherings with Friends and Family: More than three times a week    Attends Religious Services: Never    Database administrator or Organizations: No    Attends Banker Meetings: Never    Marital Status: Married  Catering manager Violence: Not At Risk (05/20/2021)   Humiliation, Afraid, Rape, and Kick questionnaire    Fear of Current or Ex-Partner: No    Emotionally Abused: No    Physically Abused: No    Sexually Abused: No   Social History   Tobacco Use  Smoking Status Former   Packs/day: 0.50   Years: 0.00   Total pack years: 0.00   Types: Cigarettes   Quit date: 06/09/2016   Years since quitting: 5.9  Smokeless Tobacco Never  Tobacco Comments   ocassional smoker -- social smoker   Social History   Substance and Sexual Activity  Alcohol Use Yes   Alcohol/week: 0.0 standard drinks of alcohol   Comment: wine OCC    Family History:  Family History  Problem Relation Age of Onset   Leukemia Mother 37   Heart disease  Father    Hypertension Sister    Hypertension Brother    Hypertension Brother    Breast cancer Neg Hx     Past medical history, surgical history, medications, allergies, family history and social history reviewed with patient today and changes made to appropriate areas of the chart.   Review of Systems - negative All other ROS negative except what is listed above and in the HPI.      Objective:    BP 133/85   Pulse 71   Temp 98.6 F (37 C) (Oral)   Ht 5' 7.25" (1.708 m)   Wt 137 lb 3.2 oz (  62.2 kg)   SpO2 98%   BMI 21.33 kg/m   Wt Readings from Last 3 Encounters:  06/01/22 137 lb 3.2 oz (62.2 kg)  02/23/22 143 lb 12.8 oz (65.2 kg)  08/25/21 145 lb 3.2 oz (65.9 kg)    Physical Exam  Results for orders placed or performed in visit on 08/25/21  [id number] 11+Oxyco+Alc+Crt-Bund  Result Value Ref Range   Ethanol Negative Cutoff=0.020 %   Amphetamines, Urine Negative Cutoff=1000 ng/mL   Barbiturate Negative Cutoff=200 ng/mL   BENZODIAZ UR QL See Final Results Cutoff=200 ng/mL   Cannabinoid Quant, Ur Negative Cutoff=50 ng/mL   Cocaine (Metabolite) Negative Cutoff=300 ng/mL   OPIATE SCREEN URINE Negative Cutoff=300 ng/mL   Oxycodone/Oxymorphone, Urine Negative Cutoff=300 ng/mL   Phencyclidine Negative Cutoff=25 ng/mL   Methadone Screen, Urine Negative Cutoff=300 ng/mL   Propoxyphene Negative Cutoff=300 ng/mL   Meperidine Negative Cutoff=200 ng/mL   Tramadol Negative Cutoff=200 ng/mL   Creatinine 96.7 20.0 - 300.0 mg/dL   pH, Urine 7.8 4.5 - 8.9  Benzodiazepines Confirm, Urine  Result Value Ref Range   Benzodiazepines Positive (A) Cutoff=100 ng/mL   Nordiazepam Negative Cutoff=100   Oxazepam Negative Cutoff=100   Flurazepam Negative Cutoff=100   Lorazepam Negative Cutoff=100   Alprazolam Positive (A)    Alprazolam Conf. 115 Cutoff=100 ng/mL   Clonazepam Negative Cutoff=100   Temazepam Negative Cutoff=100   Triazolam Negative Cutoff=100   Midazolam Negative Cutoff=100       Assessment & Plan:   Problem List Items Addressed This Visit       Cardiovascular and Mediastinum   Essential hypertension - Primary   Relevant Orders   CBC with Differential/Platelet   TSH     Other   Elevated LDL cholesterol level   Relevant Orders   Comprehensive metabolic panel   Lipid Panel w/o Chol/HDL Ratio   Generalized anxiety disorder   History of left breast cancer   Insomnia   Long term prescription benzodiazepine use   Other Visit Diagnoses     Encounter for annual physical exam       Annual physical with labs today and health maintenance reviewed with patient.        Follow up plan: No follow-ups on file.   LABORATORY TESTING:  - Pap smear: up to date  IMMUNIZATIONS:   - Tdap: Tetanus vaccination status reviewed: last tetanus booster within 10 years. - Influenza: Up to date - Pneumovax: Not applicable - Prevnar: Not applicable - HPV: Not applicable - Zostavax vaccine: Refused  SCREENING: -Mammogram: Ordered today -- due - Colonoscopy: Cologuard negative 05/26/2020 - Bone Density: Not applicable  -Hearing Test: Not applicable  -Spirometry: Not applicable   PATIENT COUNSELING:   Advised to take 1 mg of folate supplement per day if capable of pregnancy.   Sexuality: Discussed sexually transmitted diseases, partner selection, use of condoms, avoidance of unintended pregnancy  and contraceptive alternatives.   Advised to avoid cigarette smoking.  I discussed with the patient that most people either abstain from alcohol or drink within safe limits (<=14/week and <=4 drinks/occasion for males, <=7/weeks and <= 3 drinks/occasion for females) and that the risk for alcohol disorders and other health effects rises proportionally with the number of drinks per week and how often a drinker exceeds daily limits.  Discussed cessation/primary prevention of drug use and availability of treatment for abuse.   Diet: Encouraged to adjust caloric intake to  maintain  or achieve ideal body weight, to reduce intake of dietary saturated fat and total  fat, to limit sodium intake by avoiding high sodium foods and not adding table salt, and to maintain adequate dietary potassium and calcium preferably from fresh fruits, vegetables, and low-fat dairy products.    Stressed the importance of regular exercise  Injury prevention: Discussed safety belts, safety helmets, smoke detector, smoking near bedding or upholstery.   Dental health: Discussed importance of regular tooth brushing, flossing, and dental visits.    NEXT PREVENTATIVE PHYSICAL DUE IN 1 YEAR. No follow-ups on file.

## 2022-06-01 NOTE — Assessment & Plan Note (Signed)
Chronic, ongoing since loss of grandson >3 years ago. Denies SI/HI. Not taking Zoloft at this time because she wanted to trial off of it. Continues on Xanax BID. She is experiencing nausea and diarrhea, suspect this is related to anxiety. Recommend to restart Zoloft. Discussed the risks of long term benzo use. Goal to decrease Xanax use in the future with Zoloft on board. Return in 4 weeks for mood follow up.

## 2022-06-02 ENCOUNTER — Other Ambulatory Visit: Payer: Self-pay | Admitting: Nurse Practitioner

## 2022-06-02 LAB — CBC WITH DIFFERENTIAL/PLATELET
Basophils Absolute: 0 10*3/uL (ref 0.0–0.2)
Basos: 1 %
EOS (ABSOLUTE): 0 10*3/uL (ref 0.0–0.4)
Eos: 1 %
Hematocrit: 45.7 % (ref 34.0–46.6)
Hemoglobin: 15.7 g/dL (ref 11.1–15.9)
Immature Grans (Abs): 0 10*3/uL (ref 0.0–0.1)
Immature Granulocytes: 1 %
Lymphocytes Absolute: 1.6 10*3/uL (ref 0.7–3.1)
Lymphs: 26 %
MCH: 32.8 pg (ref 26.6–33.0)
MCHC: 34.4 g/dL (ref 31.5–35.7)
MCV: 95 fL (ref 79–97)
Monocytes Absolute: 0.5 10*3/uL (ref 0.1–0.9)
Monocytes: 9 %
Neutrophils Absolute: 3.9 10*3/uL (ref 1.4–7.0)
Neutrophils: 62 %
Platelets: 239 10*3/uL (ref 150–450)
RBC: 4.79 x10E6/uL (ref 3.77–5.28)
RDW: 11.5 % — ABNORMAL LOW (ref 11.7–15.4)
WBC: 6.2 10*3/uL (ref 3.4–10.8)

## 2022-06-02 LAB — COMPREHENSIVE METABOLIC PANEL
ALT: 13 IU/L (ref 0–32)
AST: 18 IU/L (ref 0–40)
Albumin/Globulin Ratio: 2.1 (ref 1.2–2.2)
Albumin: 4.9 g/dL — ABNORMAL HIGH (ref 3.8–4.8)
Alkaline Phosphatase: 75 IU/L (ref 44–121)
BUN/Creatinine Ratio: 11 — ABNORMAL LOW (ref 12–28)
BUN: 9 mg/dL (ref 8–27)
Bilirubin Total: 0.4 mg/dL (ref 0.0–1.2)
CO2: 22 mmol/L (ref 20–29)
Calcium: 10.2 mg/dL (ref 8.7–10.3)
Chloride: 100 mmol/L (ref 96–106)
Creatinine, Ser: 0.8 mg/dL (ref 0.57–1.00)
Globulin, Total: 2.3 g/dL (ref 1.5–4.5)
Glucose: 94 mg/dL (ref 70–99)
Potassium: 4.3 mmol/L (ref 3.5–5.2)
Sodium: 140 mmol/L (ref 134–144)
Total Protein: 7.2 g/dL (ref 6.0–8.5)
eGFR: 83 mL/min/{1.73_m2} (ref >59)

## 2022-06-02 LAB — LIPID PANEL W/O CHOL/HDL RATIO
Cholesterol, Total: 227 mg/dL — ABNORMAL HIGH (ref 100–199)
HDL: 70 mg/dL (ref >39)
LDL Chol Calc (NIH): 135 mg/dL — ABNORMAL HIGH (ref 0–99)
Triglycerides: 126 mg/dL (ref 0–149)
VLDL Cholesterol Cal: 22 mg/dL (ref 5–40)

## 2022-06-02 LAB — TSH: TSH: 3.02 u[IU]/mL (ref 0.450–4.500)

## 2022-06-02 MED ORDER — ROSUVASTATIN CALCIUM 10 MG PO TABS: 10.0000 mg | ORAL_TABLET | Freq: Every day | ORAL | 3 refills | Status: DC

## 2022-06-02 NOTE — Progress Notes (Signed)
Contacted via MyChart The 10-year ASCVD risk score (Arnett DK, et al., 2019) is: 10.9%   Values used to calculate the score:     Age: 61 years     Sex: Female     Is Non-Hispanic African American: No     Diabetic: No     Tobacco smoker: Yes     Systolic Blood Pressure: 496 mmHg     Is BP treated: Yes     HDL Cholesterol: 70 mg/dL     Total Cholesterol: 227 mg/dL   Good evening Lori May, your labs have returned: - CBC shows no anemia or infection. - Kidney function, creatinine and eGFR, remains normal, as is liver function, AST and ALT.   - Thyroid normal.  - Your cholesterol levels are elevated placing your ASCVD (10 year risk for stroke) level above 10%.  I do recommend we start medication at this time.  I am going to send in a low dose of Rosuvastatin for you to start to help lower these and help reduce your risk.  This is very important.  If any issues with medication let me know and we will recheck levels next visit.  Any questions? Keep being amazing!!  Thank you for allowing me to participate in your care.  I appreciate you. Kindest regards, Kamron Vanwyhe

## 2022-06-15 DIAGNOSIS — R69 Illness, unspecified: Secondary | ICD-10-CM | POA: Diagnosis not present

## 2022-06-15 DIAGNOSIS — F411 Generalized anxiety disorder: Secondary | ICD-10-CM | POA: Diagnosis not present

## 2022-06-24 DIAGNOSIS — R69 Illness, unspecified: Secondary | ICD-10-CM | POA: Diagnosis not present

## 2022-06-24 DIAGNOSIS — F411 Generalized anxiety disorder: Secondary | ICD-10-CM | POA: Diagnosis not present

## 2022-06-26 NOTE — Patient Instructions (Signed)

## 2022-06-29 ENCOUNTER — Encounter: Payer: Self-pay | Admitting: Nurse Practitioner

## 2022-06-29 ENCOUNTER — Ambulatory Visit (INDEPENDENT_AMBULATORY_CARE_PROVIDER_SITE_OTHER): Payer: 59 | Admitting: Nurse Practitioner

## 2022-06-29 VITALS — BP 118/77 | HR 64 | Temp 98.0°F | Ht 67.25 in | Wt 136.4 lb

## 2022-06-29 DIAGNOSIS — F411 Generalized anxiety disorder: Secondary | ICD-10-CM

## 2022-06-29 DIAGNOSIS — Z79899 Other long term (current) drug therapy: Secondary | ICD-10-CM | POA: Diagnosis not present

## 2022-06-29 DIAGNOSIS — R69 Illness, unspecified: Secondary | ICD-10-CM | POA: Diagnosis not present

## 2022-06-29 DIAGNOSIS — F5104 Psychophysiologic insomnia: Secondary | ICD-10-CM

## 2022-06-29 NOTE — Progress Notes (Signed)
BP 118/77   Pulse 64   Temp 98 F (36.7 C) (Oral)   Ht 5' 7.25" (1.708 m)   Wt 136 lb 6.4 oz (61.9 kg)   SpO2 96%   BMI 21.20 kg/m    Subjective:    Patient ID: Lori May, female    DOB: 1961-02-11, 61 y.o.   MRN: [id number]  HPI: Lori May is a 61 y.o. female  Chief Complaint  Patient presents with   Mood    Patient is here for a follow up on Mood. Patient says everything going well. Patient denies having any concerns at today's visit. Patient says she has stopped taking the prescription for Zoloft as the medicine was making her sick.    Anxiety   ANXIETY/STRESS Lost a grandson >3 years ago at young age to brain tumor, that is when she was placed on Xanax.  Tried different medications -- Zoloft and some others in past.  Pt made aware of risks of benzo medication use to include increased sedation, respiratory suppression, falls, dependence and cardiovascular events.  Pt would like to continue treatment as benefit determined to outweigh risk. Currently taking Xanax BID.    Last visit she had stopped taking Zoloft as she wanted to trial without it, but this caused worsening mood and we recommended she restart, at this time she reports she has stopped taking it as it was causing GI upset (nausea and headaches -- to point she almost had husband take her to emergency room).  She reports she is doing okay without it at this time.    She is attending therapy with [facility], 3rd session today.    Has taken Ambien and Trazodone for insomnia, taken for > 10 years.  Discussed at length risks of long term Ambien use and that when age 52 will need to lower dose to 5 MG.  Last Ambien fill on PDMP review was 05/18/22 (#90 tablets) and Xanax 06/01/22 (#120 tablets).  She would like to reduce of Xanax someday in the future, not at this time. Duration:stable Anxious mood:  sometimes   Excessive worrying: yes Irritability: no  Sweating: no Nausea: no Palpitations:no Hyperventilation:  no Panic attacks: no Agoraphobia: no  Obscessions/compulsions: no Depressed mood:  a little bit    06/29/2022    9:00 AM 06/01/2022   10:10 AM 02/23/2022    3:54 PM 11/24/2021    3:59 PM 08/25/2021    3:47 PM  Depression screen PHQ 2/9  Decreased Interest 0 1 0 0 0  Down, Depressed, Hopeless 1 1 0 1 0  PHQ - 2 Score 1 2 0 1 0  Altered sleeping 0 0 0 1 0  Tired, decreased energy 0 1 0 0 0  Change in appetite 0 0 0 0 0  Feeling bad or failure about yourself  0 0 0 0 0  Trouble concentrating 0 0 0 0 0  Moving slowly or fidgety/restless 0 0 0 0 0  Suicidal thoughts 0 0 0 0 0  PHQ-9 Score 1 3 0 2 0  Difficult doing work/chores Not difficult at all Not difficult at all  Not difficult at all   Anhedonia: no Weight changes: no Insomnia: does well with medications Hypersomnia: no Fatigue/loss of energy: no Feelings of worthlessness: no Feelings of guilt: no Impaired concentration/indecisiveness: no Suicidal ideations: no  Crying spells: yes Recent Stressors/Life Changes: no   Relationship problems: no   Family stress: no     Financial stress:  no    Job stress: no    Recent death/loss: no     Jul 20, 2022    9:01 AM 06/01/2022   10:11 AM 02/23/2022    3:55 PM 11/24/2021    3:59 PM  GAD 7 : Generalized Anxiety Score  Nervous, Anxious, on Edge 0 1 0 0  Control/stop worrying 0 1 0 0  Worry too much - different things 1 1 0 0  Trouble relaxing 0 0 0 0  Restless 0 0 0 0  Easily annoyed or irritable 0 0 0 0  Afraid - awful might happen 0 1 0 0  Total GAD 7 Score 1 4 0 0  Anxiety Difficulty Not difficult at all Somewhat difficult  Not difficult at all     Relevant past medical, surgical, family and social history reviewed and updated as indicated. Interim medical history since our last visit reviewed. Allergies and medications reviewed and updated.  Review of Systems  Constitutional:  Negative for activity change, appetite change, diaphoresis, fatigue and fever.  Respiratory:   Negative for cough, chest tightness and shortness of breath.   Cardiovascular:  Negative for chest pain, palpitations and leg swelling.  Gastrointestinal: Negative.   Neurological: Negative.   Psychiatric/Behavioral:  Positive for sleep disturbance. Negative for decreased concentration, self-injury and suicidal ideas. The patient is nervous/anxious.     Per HPI unless specifically indicated above     Objective:    BP 118/77   Pulse 64   Temp 98 F (36.7 C) (Oral)   Ht 5' 7.25" (1.708 m)   Wt 136 lb 6.4 oz (61.9 kg)   SpO2 96%   BMI 21.20 kg/m   Wt Readings from Last 3 Encounters:  Jul 20, 2022 136 lb 6.4 oz (61.9 kg)  06/01/22 137 lb 3.2 oz (62.2 kg)  02/23/22 143 lb 12.8 oz (65.2 kg)    Physical Exam Vitals and nursing note reviewed.  Constitutional:      General: She is awake. She is not in acute distress.    Appearance: She is well-developed. She is not ill-appearing.  HENT:     Head: Normocephalic.     Right Ear: Hearing normal.     Left Ear: Hearing normal.  Eyes:     General: Lids are normal.        Right eye: No discharge.        Left eye: No discharge.     Conjunctiva/sclera: Conjunctivae normal.  Pulmonary:     Effort: Pulmonary effort is normal. No accessory muscle usage or respiratory distress.  Musculoskeletal:     Cervical back: Normal range of motion.  Neurological:     Mental Status: She is alert and oriented to person, place, and time.  Psychiatric:        Attention and Perception: Attention normal.        Mood and Affect: Mood normal.        Behavior: Behavior normal. Behavior is cooperative.        Thought Content: Thought content normal.        Judgment: Judgment normal.     Results for orders placed or performed in visit on 06/01/22  CBC with Differential/Platelet  Result Value Ref Range   WBC 6.2 3.4 - 10.8 x10E3/uL   RBC 4.79 3.77 - 5.28 x10E6/uL   Hemoglobin 15.7 11.1 - 15.9 g/dL   Hematocrit 45.7 34.0 - 46.6 %   MCV 95 79 - 97 fL    MCH 32.8 26.6 - 33.0 pg  MCHC 34.4 31.5 - 35.7 g/dL   RDW 11.5 (L) 11.7 - 15.4 %   Platelets 239 150 - 450 x10E3/uL   Neutrophils 62 Not Estab. %   Lymphs 26 Not Estab. %   Monocytes 9 Not Estab. %   Eos 1 Not Estab. %   Basos 1 Not Estab. %   Neutrophils Absolute 3.9 1.4 - 7.0 x10E3/uL   Lymphocytes Absolute 1.6 0.7 - 3.1 x10E3/uL   Monocytes Absolute 0.5 0.1 - 0.9 x10E3/uL   EOS (ABSOLUTE) 0.0 0.0 - 0.4 x10E3/uL   Basophils Absolute 0.0 0.0 - 0.2 x10E3/uL   Immature Granulocytes 1 Not Estab. %   Immature Grans (Abs) 0.0 0.0 - 0.1 x10E3/uL  Comprehensive metabolic panel  Result Value Ref Range   Glucose 94 70 - 99 mg/dL   BUN 9 8 - 27 mg/dL   Creatinine, Ser 0.80 0.57 - 1.00 mg/dL   eGFR 83 >59 mL/min/1.73   BUN/Creatinine Ratio 11 (L) 12 - 28   Sodium 140 134 - 144 mmol/L   Potassium 4.3 3.5 - 5.2 mmol/L   Chloride 100 96 - 106 mmol/L   CO2 22 20 - 29 mmol/L   Calcium 10.2 8.7 - 10.3 mg/dL   Total Protein 7.2 6.0 - 8.5 g/dL   Albumin 4.9 (H) 3.8 - 4.8 g/dL   Globulin, Total 2.3 1.5 - 4.5 g/dL   Albumin/Globulin Ratio 2.1 1.2 - 2.2   Bilirubin Total 0.4 0.0 - 1.2 mg/dL   Alkaline Phosphatase 75 44 - 121 IU/L   AST 18 0 - 40 IU/L   ALT 13 0 - 32 IU/L  Lipid Panel w/o Chol/HDL Ratio  Result Value Ref Range   Cholesterol, Total 227 (H) 100 - 199 mg/dL   Triglycerides 126 0 - 149 mg/dL   HDL 70 >39 mg/dL   VLDL Cholesterol Cal 22 5 - 40 mg/dL   LDL Chol Calc (NIH) 135 (H) 0 - 99 mg/dL  TSH  Result Value Ref Range   TSH 3.020 0.450 - 4.500 uIU/mL      Assessment & Plan:   Problem List Items Addressed This Visit       Other   Generalized anxiety disorder - Primary    Chronic, ongoing since loss of grandson >3 years ago. Denies SI/HI. Not taking Zoloft at this time due to side effects. Continues on Xanax BID. Discussed the risks of long term benzo use. Goal to decrease Xanax use in the future, she is attending therapy at this time which should offer benefit and may  assist with Xanax reduction in future. Return in 8 weeks for mood follow up and annual UDS.      Insomnia    Chronic, ongoing for several years.  Continue current medication regimen to include Ambien and Trazodone, discussed risks of long term Ambien use and that with age will need to reduce dose. Continue good sleep hygiene.       Long term prescription benzodiazepine use    Refer to anxiety plan of care.         Follow up plan: Return in about 8 weeks (around 08/25/2022) for MOOD AND HLD  -- needs UDS and lipid check.

## 2022-06-29 NOTE — Assessment & Plan Note (Signed)
Chronic, ongoing since loss of grandson >3 years ago. Denies SI/HI. Not taking Zoloft at this time due to side effects. Continues on Xanax BID. Discussed the risks of long term benzo use. Goal to decrease Xanax use in the future, she is attending therapy at this time which should offer benefit and may assist with Xanax reduction in future. Return in 8 weeks for mood follow up and annual UDS.

## 2022-06-29 NOTE — Assessment & Plan Note (Signed)
Chronic, ongoing for several years.  Continue current medication regimen to include Ambien and Trazodone, discussed risks of long term Ambien use and that with age will need to reduce dose. Continue good sleep hygiene.  

## 2022-06-29 NOTE — Assessment & Plan Note (Signed)
Refer to anxiety plan of care. 

## 2022-07-14 DIAGNOSIS — R69 Illness, unspecified: Secondary | ICD-10-CM | POA: Diagnosis not present

## 2022-07-14 DIAGNOSIS — F411 Generalized anxiety disorder: Secondary | ICD-10-CM | POA: Diagnosis not present

## 2022-07-28 ENCOUNTER — Ambulatory Visit
Admission: RE | Admit: 2022-07-28 | Discharge: 2022-07-28 | Disposition: A | Discharge status: Home / Self Care | Payer: 59 | Source: Ambulatory Visit | Attending: Nurse Practitioner | Admitting: Nurse Practitioner

## 2022-07-28 DIAGNOSIS — Z853 Personal history of malignant neoplasm of breast: Secondary | ICD-10-CM | POA: Diagnosis not present

## 2022-07-28 DIAGNOSIS — Z1231 Encounter for screening mammogram for malignant neoplasm of breast: Secondary | ICD-10-CM | POA: Diagnosis not present

## 2022-08-03 NOTE — Progress Notes (Signed)
Contacted via MyChart   Normal mammogram, may repeat in one year:)

## 2022-08-11 ENCOUNTER — Other Ambulatory Visit: Payer: Self-pay | Admitting: Nurse Practitioner

## 2022-08-12 NOTE — Telephone Encounter (Signed)
Requested medication (s) are due for refill today - yes  Requested medication (s) are on the active medication list -yes  Future visit scheduled -yes  Last refill: 05/12/22 #90   Notes to clinic: non delegated Rx  Requested Prescriptions  Pending Prescriptions Disp Refills   zolpidem (AMBIEN) 10 MG tablet [Pharmacy Med Name: ZOLPIDEM TARTRATE 10 MG TABLET] 90 tablet     Sig: TAKE 1 TABLET BY MOUTH AT BEDTIME     Not Delegated - Psychiatry:  Anxiolytics/Hypnotics Failed - 08/11/2022  3:18 PM      Failed - This refill cannot be delegated      Passed - Urine Drug Screen completed in last 360 days      Passed - Valid encounter within last 6 months    Recent Outpatient Visits           1 month ago Generalized anxiety disorder   [place], Barbaraann Faster, NP   2 months ago Essential hypertension   [place], [place] T, NP   5 months ago Generalized anxiety disorder   [place], [place] T, NP   8 months ago Generalized anxiety disorder   [place], [place] T, NP   11 months ago Generalized anxiety disorder   [place], Barbaraann Faster, NP       Future Appointments             In 1 week Cann[place], Barbaraann Faster, NP [facility]               Requested Prescriptions  Pending Prescriptions Disp Refills   zolpidem (AMBIEN) 10 MG tablet [Pharmacy Med Name: ZOLPIDEM TARTRATE 10 MG TABLET] 90 tablet     Sig: TAKE 1 TABLET BY MOUTH AT BEDTIME     Not Delegated - Psychiatry:  Anxiolytics/Hypnotics Failed - 08/11/2022  3:18 PM      Failed - This refill cannot be delegated      Passed - Urine Drug Screen completed in last 360 days      Passed - Valid encounter within last 6 months    Recent Outpatient Visits           1 month ago Generalized anxiety disorder   [place], Barbaraann Faster, NP   2 months ago Essential hypertension   [place], [place] T, NP   5 months ago Generalized anxiety disorder   [place], [place] T, NP   8 months ago Generalized anxiety disorder   [place], [place] T, NP   11 months ago Generalized anxiety disorder   [place], Barbaraann Faster, NP       Future Appointments             In 1 week Cannady, Barbaraann Faster, NP [facility]

## 2022-08-17 DIAGNOSIS — F411 Generalized anxiety disorder: Secondary | ICD-10-CM | POA: Diagnosis not present

## 2022-08-17 DIAGNOSIS — R69 Illness, unspecified: Secondary | ICD-10-CM | POA: Diagnosis not present

## 2022-08-22 NOTE — Patient Instructions (Incomplete)

## 2022-08-25 ENCOUNTER — Ambulatory Visit (INDEPENDENT_AMBULATORY_CARE_PROVIDER_SITE_OTHER): Payer: 59 | Admitting: Nurse Practitioner

## 2022-08-25 ENCOUNTER — Encounter: Payer: Self-pay | Admitting: Nurse Practitioner

## 2022-08-25 VITALS — BP 108/72 | HR 65 | Temp 98.1°F | Ht 67.25 in | Wt 138.2 lb

## 2022-08-25 DIAGNOSIS — Z79899 Other long term (current) drug therapy: Secondary | ICD-10-CM

## 2022-08-25 DIAGNOSIS — F411 Generalized anxiety disorder: Secondary | ICD-10-CM | POA: Diagnosis not present

## 2022-08-25 DIAGNOSIS — R69 Illness, unspecified: Secondary | ICD-10-CM | POA: Diagnosis not present

## 2022-08-25 DIAGNOSIS — E782 Mixed hyperlipidemia: Secondary | ICD-10-CM

## 2022-08-25 DIAGNOSIS — K582 Mixed irritable bowel syndrome: Secondary | ICD-10-CM

## 2022-08-25 DIAGNOSIS — F5104 Psychophysiologic insomnia: Secondary | ICD-10-CM

## 2022-08-25 DIAGNOSIS — I10 Essential (primary) hypertension: Secondary | ICD-10-CM

## 2022-08-25 MED ORDER — ZOLPIDEM TARTRATE 10 MG PO TABS
10.0000 mg | ORAL_TABLET | Freq: Every day | ORAL | 0 refills | Status: DC
Start: 2022-09-10 — End: 2022-11-24

## 2022-08-25 MED ORDER — ALPRAZOLAM 0.25 MG PO TABS: 0.2500 mg | ORAL_TABLET | Freq: Two times a day (BID) | ORAL | 0 refills | Status: DC

## 2022-08-25 NOTE — Assessment & Plan Note (Signed)
Chronic, ongoing since loss of grandson >3 years ago. Denies SI/HI. Has not tolerated SSRI medications.  Continues on Xanax BID. Discussed the risks of long term benzo use. Goal to decrease Xanax use in the future, she is attending therapy at this time which is offering benefit and may assist with Xanax reduction in future. Return in 3 months.

## 2022-08-25 NOTE — Assessment & Plan Note (Signed)
Chronic, ongoing with underlying anxiety.  Will check labs today, to include Celiac check.  Continue Bentyl as ordered and recommend she start Metamucil x 2 gummies daily to help regulate bowels.  Consider GI referral if ongoing.

## 2022-08-25 NOTE — Assessment & Plan Note (Signed)
Chronic, ongoing.  Continue Crestor as is tolerating well without ADR.  Lipid panel today.

## 2022-08-25 NOTE — Assessment & Plan Note (Signed)
Refer to anxiety plan of care.  UDS updated today.

## 2022-08-25 NOTE — Progress Notes (Signed)
BP 108/72   Pulse 65   Temp 98.1 F (36.7 C) (Oral)   Ht 5' 7.25" (1.708 m)   Wt 138 lb 3.2 oz (62.7 kg)   SpO2 98%   BMI 21.48 kg/m    Subjective:    Patient ID: Lori May, female    DOB: 06/18/1961, 61 y.o.   MRN: [id number]  HPI: Lori May is a 61 y.o. female  Chief Complaint  Patient presents with   Mood   Hyperlipidemia   HYPERLIPIDEMIA Continues to tolerate Crestor daily. Hyperlipidemia status: good compliance Satisfied with current treatment?  yes Side effects:  no Medication compliance: good compliance Past cholesterol meds: Crestor Supplements: none Aspirin:  no The 10-year ASCVD risk score (Arnett DK, et al., 2019) is: 3.7%   Values used to calculate the score:     Age: 7 years     Sex: Female     Is Non-Hispanic African American: No     Diabetic: No     Tobacco smoker: No     Systolic Blood Pressure: 096 mmHg     Is BP treated: Yes     HDL Cholesterol: 70 mg/dL     Total Cholesterol: 227 mg/dL Chest pain:  no Coronary artery disease:  no Family history CAD:  yes = father Family history early CAD:  no   ANXIETY/STRESS Lost grandson >3 years ago at young age to brain tumor, when she was placed on Xanax and takes this daily.  Tried different medications -- Zoloft and some others in past -- did not tolerate.  Pt made aware of risks of benzo medication use to include increased sedation, respiratory suppression, falls, dependence and cardiovascular events.  Pt would like to continue treatment as benefit determined to outweigh risk. Currently taking Xanax BID.    She is attending therapy with [place], this is offering benefit.  She does continue to have bowel issues -- gets nauseous all the time and has mix between diarrhea and constipation fluctuate.  Always feels bloated.  Taking Bentyl -- she is not sure if this is offering benefit.  Has taken Ambien and Trazodone for insomnia, taken for > 10 years.  Discussed at length risks of long term  Ambien use and that when age 31 will need to lower dose to 5 MG.  Last Ambien fill on PDMP review was 08/12/22 (#90 tablets) and Xanax 06/01/22 (#120 tablets).  She would like to reduce off Xanax someday in the future, not at this time. Duration: stable Anxious mood: no Excessive worrying: occasional Irritability: no  Sweating: no Nausea: no Palpitations:no Hyperventilation: no Panic attacks: no Agoraphobia: no  Obscessions/compulsions: no Depressed mood: no    08/25/2022    9:54 AM 06/29/2022    9:00 AM 06/01/2022   10:10 AM 02/23/2022    3:54 PM 11/24/2021    3:59 PM  Depression screen PHQ 2/9  Decreased Interest 0 0 1 0 0  Down, Depressed, Hopeless 0 1 1 0 1  PHQ - 2 Score 0 1 2 0 1  Altered sleeping 0 0 0 0 1  Tired, decreased energy 0 0 1 0 0  Change in appetite 0 0 0 0 0  Feeling bad or failure about yourself  0 0 0 0 0  Trouble concentrating 0 0 0 0 0  Moving slowly or fidgety/restless 0 0 0 0 0  Suicidal thoughts 0 0 0 0 0  PHQ-9 Score 0 1 3 0 2  Difficult doing  work/chores Not difficult at all Not difficult at all Not difficult at all  Not difficult at all  Anhedonia: no Weight changes: no Insomnia: no Hypersomnia: no Fatigue/loss of energy: no Feelings of worthlessness: no Feelings of guilt: no Impaired concentration/indecisiveness: no Suicidal ideations: no  Crying spells: no Recent Stressors/Life Changes: no   Relationship problems: no   Family stress: no     Financial stress: no    Job stress: no    Recent death/loss: no     09-21-2022    9:54 AM 06/29/2022    9:01 AM 06/01/2022   10:11 AM 02/23/2022    3:55 PM  GAD 7 : Generalized Anxiety Score  Nervous, Anxious, on Edge 0 0 1 0  Control/stop worrying 0 0 1 0  Worry too much - different things 0 1 1 0  Trouble relaxing 0 0 0 0  Restless 0 0 0 0  Easily annoyed or irritable 0 0 0 0  Afraid - awful might happen 0 0 1 0  Total GAD 7 Score 0 1 4 0  Anxiety Difficulty Not difficult at all Not difficult at  all Somewhat difficult      Relevant past medical, surgical, family and social history reviewed and updated as indicated. Interim medical history since our last visit reviewed. Allergies and medications reviewed and updated.  Review of Systems  Constitutional:  Negative for activity change, appetite change, diaphoresis, fatigue and fever.  Respiratory:  Negative for cough, chest tightness and shortness of breath.   Cardiovascular:  Negative for chest pain, palpitations and leg swelling.  Gastrointestinal: Negative.   Neurological: Negative.   Psychiatric/Behavioral:  Positive for sleep disturbance. Negative for decreased concentration, self-injury and suicidal ideas. The patient is nervous/anxious.     Per HPI unless specifically indicated above     Objective:    BP 108/72   Pulse 65   Temp 98.1 F (36.7 C) (Oral)   Ht 5' 7.25" (1.708 m)   Wt 138 lb 3.2 oz (62.7 kg)   SpO2 98%   BMI 21.48 kg/m   Wt Readings from Last 3 Encounters:  2022-09-21 138 lb 3.2 oz (62.7 kg)  06/29/22 136 lb 6.4 oz (61.9 kg)  06/01/22 137 lb 3.2 oz (62.2 kg)    Physical Exam Vitals and nursing note reviewed.  Constitutional:      General: She is awake. She is not in acute distress.    Appearance: She is well-developed. She is not ill-appearing.  HENT:     Head: Normocephalic.     Right Ear: Hearing normal.     Left Ear: Hearing normal.  Eyes:     General: Lids are normal.        Right eye: No discharge.        Left eye: No discharge.     Conjunctiva/sclera: Conjunctivae normal.  Pulmonary:     Effort: Pulmonary effort is normal. No accessory muscle usage or respiratory distress.  Musculoskeletal:     Cervical back: Normal range of motion.  Neurological:     Mental Status: She is alert and oriented to person, place, and time.  Psychiatric:        Attention and Perception: Attention normal.        Mood and Affect: Mood normal.        Behavior: Behavior normal. Behavior is cooperative.         Thought Content: Thought content normal.        Judgment: Judgment normal.  Results for orders placed or performed in visit on 06/01/22  CBC with Differential/Platelet  Result Value Ref Range   WBC 6.2 3.4 - 10.8 x10E3/uL   RBC 4.79 3.77 - 5.28 x10E6/uL   Hemoglobin 15.7 11.1 - 15.9 g/dL   Hematocrit 45.7 34.0 - 46.6 %   MCV 95 79 - 97 fL   MCH 32.8 26.6 - 33.0 pg   MCHC 34.4 31.5 - 35.7 g/dL   RDW 11.5 (L) 11.7 - 15.4 %   Platelets 239 150 - 450 x10E3/uL   Neutrophils 62 Not Estab. %   Lymphs 26 Not Estab. %   Monocytes 9 Not Estab. %   Eos 1 Not Estab. %   Basos 1 Not Estab. %   Neutrophils Absolute 3.9 1.4 - 7.0 x10E3/uL   Lymphocytes Absolute 1.6 0.7 - 3.1 x10E3/uL   Monocytes Absolute 0.5 0.1 - 0.9 x10E3/uL   EOS (ABSOLUTE) 0.0 0.0 - 0.4 x10E3/uL   Basophils Absolute 0.0 0.0 - 0.2 x10E3/uL   Immature Granulocytes 1 Not Estab. %   Immature Grans (Abs) 0.0 0.0 - 0.1 x10E3/uL  Comprehensive metabolic panel  Result Value Ref Range   Glucose 94 70 - 99 mg/dL   BUN 9 8 - 27 mg/dL   Creatinine, Ser 0.80 0.57 - 1.00 mg/dL   eGFR 83 >59 mL/min/1.73   BUN/Creatinine Ratio 11 (L) 12 - 28   Sodium 140 134 - 144 mmol/L   Potassium 4.3 3.5 - 5.2 mmol/L   Chloride 100 96 - 106 mmol/L   CO2 22 20 - 29 mmol/L   Calcium 10.2 8.7 - 10.3 mg/dL   Total Protein 7.2 6.0 - 8.5 g/dL   Albumin 4.9 (H) 3.8 - 4.8 g/dL   Globulin, Total 2.3 1.5 - 4.5 g/dL   Albumin/Globulin Ratio 2.1 1.2 - 2.2   Bilirubin Total 0.4 0.0 - 1.2 mg/dL   Alkaline Phosphatase 75 44 - 121 IU/L   AST 18 0 - 40 IU/L   ALT 13 0 - 32 IU/L  Lipid Panel w/o Chol/HDL Ratio  Result Value Ref Range   Cholesterol, Total 227 (H) 100 - 199 mg/dL   Triglycerides 126 0 - 149 mg/dL   HDL 70 >39 mg/dL   VLDL Cholesterol Cal 22 5 - 40 mg/dL   LDL Chol Calc (NIH) 135 (H) 0 - 99 mg/dL  TSH  Result Value Ref Range   TSH 3.020 0.450 - 4.500 uIU/mL      Assessment & Plan:   Problem List Items Addressed This Visit        Digestive   Irritable bowel syndrome with both constipation and diarrhea    Chronic, ongoing with underlying anxiety.  Will check labs today, to include Celiac check.  Continue Bentyl as ordered and recommend she start Metamucil x 2 gummies daily to help regulate bowels.  Consider GI referral if ongoing.      Relevant Orders   Celiac Disease Panel   Comprehensive metabolic panel     Other   Generalized anxiety disorder - Primary    Chronic, ongoing since loss of grandson >3 years ago. Denies SI/HI. Has not tolerated SSRI medications.  Continues on Xanax BID. Discussed the risks of long term benzo use. Goal to decrease Xanax use in the future, she is attending therapy at this time which is offering benefit and may assist with Xanax reduction in future. Return in 3 months.      Relevant Medications   ALPRAZolam (XANAX) 0.25 MG  tablet (Start on 08/30/2022)   Other Relevant Orders   [id number] 11+Oxyco+Alc+Crt-Bund   Hyperlipidemia, mixed    Chronic, ongoing.  Continue Crestor as is tolerating well without ADR.  Lipid panel today.      Relevant Orders   Lipid Panel w/o Chol/HDL Ratio   Insomnia    Chronic, ongoing for several years.  Continue current medication regimen to include Ambien and Trazodone, discussed risks of long term Ambien use and that with age will need to reduce dose. Continue good sleep hygiene.       Long term prescription benzodiazepine use    Refer to anxiety plan of care.  UDS updated today.      Relevant Orders   [id number] 11+Oxyco+Alc+Crt-Bund     Follow up plan: Return in about 3 months (around 11/24/2022) for MOOD, IBS.

## 2022-08-25 NOTE — Assessment & Plan Note (Signed)
Chronic, ongoing for several years.  Continue current medication regimen to include Ambien and Trazodone, discussed risks of long term Ambien use and that with age will need to reduce dose. Continue good sleep hygiene.  

## 2022-08-26 NOTE — Progress Notes (Signed)
Contacted via MyChart   Good morning [name], your labs have returned and are overall normal.  Cholesterol labs look a lot better.  Continues current medication!!  Great job!! Keep being amazing!!  Thank you for allowing me to participate in your care.  I appreciate you. Kindest regards, Kyndall Chaplin

## 2022-08-27 NOTE — Progress Notes (Signed)
So far celiac labs appear negative:)

## 2022-08-29 LAB — COMPREHENSIVE METABOLIC PANEL
ALT: 20 IU/L (ref 0–32)
AST: 24 IU/L (ref 0–40)
Albumin/Globulin Ratio: 2.2 (ref 1.2–2.2)
Albumin: 5 g/dL — ABNORMAL HIGH (ref 3.9–4.9)
Alkaline Phosphatase: 82 IU/L (ref 44–121)
BUN/Creatinine Ratio: 11 — ABNORMAL LOW (ref 12–28)
BUN: 9 mg/dL (ref 8–27)
Bilirubin Total: 0.4 mg/dL (ref 0.0–1.2)
CO2: 23 mmol/L (ref 20–29)
Calcium: 10.1 mg/dL (ref 8.7–10.3)
Chloride: 100 mmol/L (ref 96–106)
Creatinine, Ser: 0.79 mg/dL (ref 0.57–1.00)
Globulin, Total: 2.3 g/dL (ref 1.5–4.5)
Glucose: 86 mg/dL (ref 70–99)
Potassium: 4.1 mmol/L (ref 3.5–5.2)
Sodium: 140 mmol/L (ref 134–144)
Total Protein: 7.3 g/dL (ref 6.0–8.5)
eGFR: 85 mL/min/{1.73_m2} (ref >59)

## 2022-08-29 LAB — LIPID PANEL W/O CHOL/HDL RATIO
Cholesterol, Total: 188 mg/dL (ref 100–199)
HDL: 85 mg/dL (ref >39)
LDL Chol Calc (NIH): 88 mg/dL (ref 0–99)
Triglycerides: 82 mg/dL (ref 0–149)
VLDL Cholesterol Cal: 15 mg/dL (ref 5–40)

## 2022-08-29 LAB — CELIAC DISEASE PANEL
Endomysial IgA: NEGATIVE
IgA/Immunoglobulin A, Serum: 210 mg/dL (ref 87–352)
Transglutaminase IgA: <2 U/mL (ref 0–3)

## 2022-08-31 LAB — DRUG SCREEN 764883 11+OXYCO+ALC+CRT-BUND
Amphetamines, Urine: NEGATIVE ng/mL
Barbiturate: NEGATIVE ng/mL
Cannabinoid Quant, Ur: NEGATIVE ng/mL
Cocaine (Metabolite): NEGATIVE ng/mL
Creatinine: 121.4 mg/dL (ref 20.0–300.0)
Ethanol: NEGATIVE %
Meperidine: NEGATIVE ng/mL
Methadone Screen, Urine: NEGATIVE ng/mL
OPIATE SCREEN URINE: NEGATIVE ng/mL
Oxycodone/Oxymorphone, Urine: NEGATIVE ng/mL
Phencyclidine: NEGATIVE ng/mL
Propoxyphene: NEGATIVE ng/mL
Tramadol: NEGATIVE ng/mL
pH, Urine: 5.8 (ref 4.5–8.9)

## 2022-08-31 LAB — BENZODIAZEPINES CONFIRM, URINE
Alprazolam Conf.: 207 ng/mL
Alprazolam: POSITIVE — AB
Benzodiazepines: POSITIVE ng/mL — AB
Clonazepam: NEGATIVE
Flurazepam: NEGATIVE
Lorazepam: NEGATIVE
Midazolam: NEGATIVE
Nordiazepam: NEGATIVE
Oxazepam: NEGATIVE
Temazepam: NEGATIVE
Triazolam: NEGATIVE

## 2022-09-13 ENCOUNTER — Encounter: Payer: Self-pay | Admitting: Nurse Practitioner

## 2022-09-13 ENCOUNTER — Ambulatory Visit (INDEPENDENT_AMBULATORY_CARE_PROVIDER_SITE_OTHER): Payer: 59 | Admitting: Nurse Practitioner

## 2022-09-13 VITALS — BP 120/79 | HR 71 | Temp 98.2°F | Ht 67.25 in | Wt 138.0 lb

## 2022-09-13 DIAGNOSIS — R399 Unspecified symptoms and signs involving the genitourinary system: Secondary | ICD-10-CM | POA: Diagnosis not present

## 2022-09-13 DIAGNOSIS — R8281 Pyuria: Secondary | ICD-10-CM

## 2022-09-13 LAB — URINALYSIS, ROUTINE W REFLEX MICROSCOPIC
Bilirubin, UA: NEGATIVE
Glucose, UA: NEGATIVE
Nitrite, UA: NEGATIVE
Specific Gravity, UA: >1.03 — ABNORMAL HIGH (ref 1.005–1.030)
Urobilinogen, Ur: 0.2 mg/dL (ref 0.2–1.0)
pH, UA: 6 (ref 5.0–7.5)

## 2022-09-13 LAB — MICROSCOPIC EXAMINATION

## 2022-09-13 MED ORDER — FLUCONAZOLE 150 MG PO TABS: 150.0000 mg | ORAL_TABLET | Freq: Once | ORAL | 0 refills | Status: AC

## 2022-09-13 MED ORDER — NITROFURANTOIN MONOHYD MACRO 100 MG PO CAPS: 100.0000 mg | ORAL_CAPSULE | Freq: Two times a day (BID) | ORAL | 0 refills | Status: DC

## 2022-09-13 NOTE — Progress Notes (Signed)
Acute Office Visit  Subjective:     Patient ID: Lori May, female    DOB: 09-28-1961, 61 y.o.   MRN: [id number]  Chief Complaint  Patient presents with   Urinary Tract Infection    Patient is here for possible UTI symptoms.    Dysuria   Urinary Retention    Started with symptoms late Thursday and Friday tried over the counter Azo.  Continues to have burning. Has only had about 2 UTI in her life.    Urinary Tract Infection  This is a new problem. The current episode started in the past 7 days. The problem occurs every urination. The problem has been unchanged. The quality of the pain is described as aching. The pain is at a severity of 2/10. The pain is mild. There has been no fever. She is Sexually active. There is No history of pyelonephritis. Associated symptoms include frequency, hesitancy and urgency. Pertinent negatives include no chills, discharge, flank pain, hematuria, nausea, sweats or vomiting. She has tried increased fluids (Azo over the counter) for the symptoms. The treatment provided no relief. There is no history of kidney stones or recurrent UTIs.   Patient is in today for urinary tract infection symptoms.  Review of Systems  Constitutional:  Negative for chills and fever.  Respiratory: Negative.    Cardiovascular: Negative.   Gastrointestinal:  Negative for abdominal pain, nausea and vomiting.  Genitourinary:  Positive for frequency, hesitancy and urgency. Negative for flank pain and hematuria.  Neurological: Negative.   Psychiatric/Behavioral: Negative.        Objective:    BP 120/79   Pulse 71   Temp 98.2 F (36.8 C) (Oral)   Ht 5' 7.25" (1.708 m)   Wt 138 lb (62.6 kg)   SpO2 98%   BMI 21.45 kg/m  BP Readings from Last 3 Encounters:  09/13/22 120/79  08/25/22 108/72  06/29/22 118/77   Wt Readings from Last 3 Encounters:  09/13/22 138 lb (62.6 kg)  08/25/22 138 lb 3.2 oz (62.7 kg)  06/29/22 136 lb 6.4 oz (61.9 kg)   Physical Exam Vitals and  nursing note reviewed.  Constitutional:      General: She is awake. She is not in acute distress.    Appearance: She is well-developed and well-groomed. She is not ill-appearing or toxic-appearing.  HENT:     Head: Normocephalic.     Right Ear: Hearing normal.     Left Ear: Hearing normal.  Eyes:     General: Lids are normal.        Right eye: No discharge.        Left eye: No discharge.     Conjunctiva/sclera: Conjunctivae normal.     Pupils: Pupils are equal, round, and reactive to light.  Cardiovascular:     Rate and Rhythm: Normal rate and regular rhythm.     Heart sounds: Normal heart sounds. No murmur heard.    No gallop.  Pulmonary:     Effort: Pulmonary effort is normal. No accessory muscle usage or respiratory distress.     Breath sounds: Normal breath sounds.  Abdominal:     General: Bowel sounds are normal. There is no distension.     Palpations: Abdomen is soft.     Tenderness: There is no abdominal tenderness. There is no right CVA tenderness or left CVA tenderness.  Musculoskeletal:     Cervical back: Normal range of motion and neck supple.     Right lower leg: No  edema.     Left lower leg: No edema.  Skin:    General: Skin is warm and dry.  Neurological:     Mental Status: She is alert and oriented to person, place, and time.  Psychiatric:        Attention and Perception: Attention normal.        Mood and Affect: Mood normal.        Speech: Speech normal.        Behavior: Behavior normal. Behavior is cooperative.        Thought Content: Thought content normal.    No results found for any visits on 09/13/22.      Assessment & Plan:   Problem List Items Addressed This Visit       Other   Urinary symptom or sign - Primary    Acute for 4 days with UA noting multiple abnormal findings.  Will send for culture and start Macrobid 100 MG BID for 5 days + Diflucan dose x 1 sent in case yeast infection presents with abx use.  Educated her on treatment regimen  and urine findings.  Discussed with her if urine culture returns noting need for alternate abx regimen will alert her and change as needed.  Recommend continue increased hydration at home.      Relevant Orders   Urinalysis, Routine w reflex microscopic   Other Visit Diagnoses     Pyuria       Urine sent for culture.   Relevant Orders   Urine Culture       Meds ordered this encounter  Medications   nitrofurantoin, macrocrystal-monohydrate, (MACROBID) 100 MG capsule    Sig: Take 1 capsule (100 mg total) by mouth 2 (two) times daily for 5 days.    Dispense:  10 capsule    Refill:  0   fluconazole (DIFLUCAN) 150 MG tablet    Sig: Take 1 tablet (150 mg total) by mouth once for 1 dose.    Dispense:  1 tablet    Refill:  0    Return if symptoms worsen or fail to improve.  Venita Lick, NP

## 2022-09-13 NOTE — Assessment & Plan Note (Signed)
Acute for 4 days with UA noting multiple abnormal findings.  Will send for culture and start Macrobid 100 MG BID for 5 days + Diflucan dose x 1 sent in case yeast infection presents with abx use.  Educated her on treatment regimen and urine findings.  Discussed with her if urine culture returns noting need for alternate abx regimen will alert her and change as needed.  Recommend continue increased hydration at home.

## 2022-09-13 NOTE — Patient Instructions (Signed)
Urinary Tract Infection, Adult A urinary tract infection (UTI) is an infection of any part of the urinary tract. The urinary tract includes: The kidneys. The ureters. The bladder. The urethra. These organs make, store, and get rid of pee (urine) in the body. What are the causes? This infection is caused by germs (bacteria) in your genital area. These germs grow and cause swelling (inflammation) of your urinary tract. What increases the risk? The following factors may make you more likely to develop this condition: Using a small, thin tube (catheter) to drain pee. Not being able to control when you pee or poop (incontinence). Being female. If you are female, these things can increase the risk: Using these methods to prevent pregnancy: A medicine that kills sperm (spermicide). A device that blocks sperm (diaphragm). Having low levels of a female hormone (estrogen). Being pregnant. You are more likely to develop this condition if: You have genes that add to your risk. You are sexually active. You take antibiotic medicines. You have trouble peeing because of: A prostate that is bigger than normal, if you are female. A blockage in the part of your body that drains pee from the bladder. A kidney stone. A nerve condition that affects your bladder. Not getting enough to drink. Not peeing often enough. You have other conditions, such as: Diabetes. A weak disease-fighting system (immune system). Sickle cell disease. Gout. Injury of the spine. What are the signs or symptoms? Symptoms of this condition include: Needing to pee right away. Peeing small amounts often. Pain or burning when peeing. Blood in the pee. Pee that smells bad or not like normal. Trouble peeing. Pee that is cloudy. Fluid coming from the vagina, if you are female. Pain in the belly or lower back. Other symptoms include: Vomiting. Not feeling hungry. Feeling mixed up (confused). This may be the first symptom in  older adults. Being tired and grouchy (irritable). A fever. Watery poop (diarrhea). How is this treated? Taking antibiotic medicine. Taking other medicines. Drinking enough water. In some cases, you may need to see a specialist. Follow these instructions at home:  Medicines Take over-the-counter and prescription medicines only as told by your doctor. If you were prescribed an antibiotic medicine, take it as told by your doctor. Do not stop taking it even if you start to feel better. General instructions Make sure you: Pee until your bladder is empty. Do not hold pee for a long time. Empty your bladder after sex. Wipe from front to back after peeing or pooping if you are a female. Use each tissue one time when you wipe. Drink enough fluid to keep your pee pale yellow. Keep all follow-up visits. Contact a doctor if: You do not get better after 1-2 days. Your symptoms go away and then come back. Get help right away if: You have very bad back pain. You have very bad pain in your lower belly. You have a fever. You have chills. You feeling like you will vomit or you vomit. Summary A urinary tract infection (UTI) is an infection of any part of the urinary tract. This condition is caused by germs in your genital area. There are many risk factors for a UTI. Treatment includes antibiotic medicines. Drink enough fluid to keep your pee pale yellow. This information is not intended to replace advice given to you by your health care provider. Make sure you discuss any questions you have with your health care provider. Document Revised: 07/04/2020 Document Reviewed: 07/04/2020 Elsevier Patient Education    2023 [employer]  

## 2022-09-15 ENCOUNTER — Other Ambulatory Visit: Payer: Self-pay | Admitting: Nurse Practitioner

## 2022-09-15 LAB — URINE CULTURE

## 2022-09-15 MED ORDER — AMOXICILLIN-POT CLAVULANATE 875-125 MG PO TABS: 1.0000 | ORAL_TABLET | Freq: Two times a day (BID) | ORAL | 0 refills | Status: AC

## 2022-09-15 NOTE — Telephone Encounter (Signed)
Medication Refill - Medication: amoxicillin-clavulanate (AUGMENTIN) 875-125 MG tablet  Pt stated she is currently out of town, and PCP has sent her a message to start new medication for UTI. Pt is asking if medication can be transferred to the pharmacy below to pick up and start medication ASAP; if not, it will not start until Friday.  Has the patient contacted their pharmacy? No. (Agent: If no, request that the patient contact the pharmacy for the refill. If patient does not wish to contact the pharmacy document the reason why and proceed with request.) (Agent: If yes, when and what did the pharmacy advise?)  Preferred Pharmacy (with phone number or street name):  [id number] [place] - [id number]  [id number] [place] Phone: [phone]: [phone] Hours: Not open 24 hours   Has the patient been seen for an appointment in the last year OR does the patient have an upcoming appointment? Yes.    Agent: Please be advised that RX refills may take up to 3 business days. We ask that you follow-up with your pharmacy.

## 2022-09-15 NOTE — Addendum Note (Signed)
Addended by: Marnee Guarneri T on: 09/15/2022 12:22 PM   Modules accepted: Orders

## 2022-09-15 NOTE — Progress Notes (Signed)
Contacted via MyChart   Good afternoon Lori May, your urine culture did return showing growth.  However, this is not susceptible to Macrobid which you are taking.  Please stop this and start the Augmentin I have sent in -- this will better treat the bacteria present in urine. Any questions? Keep being awesome!!  Thank you for allowing me to participate in your care.  I appreciate you. Kindest regards, Ambry Dix

## 2022-09-16 NOTE — Telephone Encounter (Signed)
Requested medication (s) are due for refill today: yes to another pharmacy  Requested medication (s) are on the active medication list: yes  Last refill:  09/15/22  Future visit scheduled: yes  Notes to clinic:  Unable to refill per protocol, Pt stated she is currently out of town, and PCP has sent her a message to start new medication for UTI. Pt is asking if medication can be transferred to the pharmacy below to pick up and start medication ASAP; if not, it will not start until Friday.     Requested Prescriptions  Pending Prescriptions Disp Refills   amoxicillin-clavulanate (AUGMENTIN) 875-125 MG tablet 10 tablet 0    Sig: Take 1 tablet by mouth 2 (two) times daily for 5 days.     Off-Protocol Failed - 09/15/2022  2:24 PM      Failed - Medication not assigned to a protocol, review manually.      Passed - Valid encounter within last 12 months    Recent Outpatient Visits           3 days ago Urinary symptom or sign   [place], Barbaraann Faster, NP   3 weeks ago Generalized anxiety disorder   [place], Victoria T, NP   2 months ago Generalized anxiety disorder   [place], Barbaraann Faster, NP   3 months ago Essential hypertension   [place], [place] T, NP   6 months ago Generalized anxiety disorder   [place], Barbaraann Faster, NP       Future Appointments             In 2 months Cannady, Barbaraann Faster, NP [facility], [facility]

## 2022-11-21 NOTE — Patient Instructions (Signed)

## 2022-11-24 ENCOUNTER — Ambulatory Visit: Payer: 59 | Admitting: Nurse Practitioner

## 2022-11-24 ENCOUNTER — Encounter: Payer: Self-pay | Admitting: Nurse Practitioner

## 2022-11-24 VITALS — BP 128/87 | HR 75 | Temp 97.9°F | Ht 67.24 in | Wt 139.3 lb

## 2022-11-24 DIAGNOSIS — R69 Illness, unspecified: Secondary | ICD-10-CM | POA: Diagnosis not present

## 2022-11-24 DIAGNOSIS — F5104 Psychophysiologic insomnia: Secondary | ICD-10-CM

## 2022-11-24 DIAGNOSIS — K582 Mixed irritable bowel syndrome: Secondary | ICD-10-CM

## 2022-11-24 DIAGNOSIS — Z79899 Other long term (current) drug therapy: Secondary | ICD-10-CM

## 2022-11-24 DIAGNOSIS — F411 Generalized anxiety disorder: Secondary | ICD-10-CM

## 2022-11-24 MED ORDER — ALPRAZOLAM 0.25 MG PO TABS: 0.2500 mg | ORAL_TABLET | Freq: Two times a day (BID) | ORAL | 0 refills | Status: DC

## 2022-11-24 MED ORDER — LISINOPRIL 10 MG PO TABS: 10.0000 mg | ORAL_TABLET | Freq: Every morning | ORAL | 4 refills | Status: DC

## 2022-11-24 MED ORDER — DICYCLOMINE HCL 10 MG PO CAPS: 10.0000 mg | ORAL_CAPSULE | Freq: Three times a day (TID) | ORAL | 12 refills | Status: AC

## 2022-11-24 MED ORDER — ROSUVASTATIN CALCIUM 10 MG PO TABS: 10.0000 mg | ORAL_TABLET | Freq: Every day | ORAL | 4 refills | Status: DC

## 2022-11-24 MED ORDER — ZOLPIDEM TARTRATE 10 MG PO TABS: 10.0000 mg | ORAL_TABLET | Freq: Every day | ORAL | 0 refills | Status: DC

## 2022-11-24 MED ORDER — TRAZODONE HCL 100 MG PO TABS: 100.0000 mg | ORAL_TABLET | Freq: Every day | ORAL | 4 refills | Status: DC

## 2022-11-24 NOTE — Assessment & Plan Note (Signed)
Chronic, ongoing since loss of grandson >3 years ago. Denies SI/HI. Has not tolerated SSRI medications.  Continues on Xanax BID. Discussed the risks of long term benzo use. Goal to decrease Xanax use in the future, she is attending therapy at this time which is offering benefit and may assist with Xanax reduction in future. UDS due next 08/26/23.  Return in 3 months.

## 2022-11-24 NOTE — Assessment & Plan Note (Signed)
Refer to anxiety plan of care.  UDS due next 08/26/23.

## 2022-11-24 NOTE — Assessment & Plan Note (Signed)
Chronic, ongoing for several years.  Continue current medication regimen to include Ambien and Trazodone, discussed risks of long term Ambien use and that with age will need to reduce dose. Continue good sleep hygiene.

## 2022-11-24 NOTE — Progress Notes (Signed)
BP 128/87   Pulse 75   Temp 97.9 F (36.6 C) (Oral)   Ht 5' 7.24" (1.708 m)   Wt 139 lb 4.8 oz (63.2 kg)   SpO2 99%   BMI 21.66 kg/m    Subjective:    Patient ID: Lori May, female    DOB: [birth year], 61 y.o.   MRN: [id number]  HPI: Lori May is a 61 y.o. female  Chief Complaint  Patient presents with   Mood   Irritable Bowel Syndrome   ANXIETY/STRESS Follow-up today.  Lost grandson >3 years ago at young age to brain tumor, was placed on Xanax.  Tried different medications -- Zoloft and some others in past -- did not tolerate.  Pt made aware of risks of benzo medication use to include increased sedation, respiratory suppression, falls, dependence and cardiovascular events.  Pt would like to continue treatment as benefit determined to outweigh risk. Currently taking Xanax BID.  She would like to reduce off Xanax someday in the future, not at this time.  She is attending therapy with Derryl [place], this is offering benefit -- does not see as often, but when needs her she calls her.  Taking Bentyl for IBS which she reports has improved her GI symptoms.    Taken Ambien and Trazodone for insomnia, taken for > 10 years.  Discussed at length risks of long term Ambien use and that when age 77 will need to lower dose to 5 MG.  Last Ambien fill on PDMP review was 11/10/22 (#90 tablets) and Xanax 09/08/22 (#120 tablets).   Duration: controlled Anxious mood: occasional -- this is first year in 5 years since loss that she has hosted Christmas party and decorated Excessive worrying: occasional Irritability: no  Sweating: no Nausea: no Palpitations:no Hyperventilation: no Panic attacks: no Agoraphobia: no  Obscessions/compulsions: no Depressed mood: no    12/01/2022   10:14 AM 09/13/2022    3:26 PM 08/25/2022    9:54 AM 06/29/2022    9:00 AM 06/01/2022   10:10 AM  Depression screen PHQ 2/9  Decreased Interest 0 0 0 0 1  Down, Depressed, Hopeless 0 0 0 1 1  PHQ - 2 Score 0 0 0 1  2  Altered sleeping 0 0 0 0 0  Tired, decreased energy 0 0 0 0 1  Change in appetite 0 0 0 0 0  Feeling bad or failure about yourself  0 0 0 0 0  Trouble concentrating 0 0 0 0 0  Moving slowly or fidgety/restless 0 0 0 0 0  Suicidal thoughts 0 0 0 0 0  PHQ-9 Score 0 0 0 1 3  Difficult doing work/chores Not difficult at all Not difficult at all Not difficult at all Not difficult at all Not difficult at all  Anhedonia: no Weight changes: no Insomnia: no Hypersomnia: no Fatigue/loss of energy: no Feelings of worthlessness: no Feelings of guilt: no Impaired concentration/indecisiveness: no Suicidal ideations: no  Crying spells: no Recent Stressors/Life Changes: no   Relationship problems: no   Family stress: no     Financial stress: no    Job stress: no    Recent death/loss: no     12/01/2022   10:14 AM 09/13/2022    3:26 PM 08/25/2022    9:54 AM 06/29/2022    9:01 AM  GAD 7 : Generalized Anxiety Score  Nervous, Anxious, on Edge 0 0 0 0  Control/stop worrying 0 0 0 0  Worry too much -  different things 0 0 0 1  Trouble relaxing 0 0 0 0  Restless 0 0 0 0  Easily annoyed or irritable 0 0 0 0  Afraid - awful might happen 0 0 0 0  Total GAD 7 Score 0 0 0 1  Anxiety Difficulty Not difficult at all Not difficult at all Not difficult at all Not difficult at all     Relevant past medical, surgical, family and social history reviewed and updated as indicated. Interim medical history since our last visit reviewed. Allergies and medications reviewed and updated.  Review of Systems  Constitutional:  Negative for activity change, appetite change, diaphoresis, fatigue and fever.  Respiratory:  Negative for cough, chest tightness and shortness of breath.   Cardiovascular:  Negative for chest pain, palpitations and leg swelling.  Gastrointestinal: Negative.   Neurological: Negative.   Psychiatric/Behavioral:  Positive for sleep disturbance. Negative for decreased concentration,  self-injury and suicidal ideas. The patient is nervous/anxious.     Per HPI unless specifically indicated above     Objective:    BP 128/87   Pulse 75   Temp 97.9 F (36.6 C) (Oral)   Ht 5' 7.24" (1.708 m)   Wt 139 lb 4.8 oz (63.2 kg)   SpO2 99%   BMI 21.66 kg/m   Wt Readings from Last 3 Encounters:  11/24/22 139 lb 4.8 oz (63.2 kg)  09/13/22 138 lb (62.6 kg)  08/25/22 138 lb 3.2 oz (62.7 kg)    Physical Exam Vitals and nursing note reviewed.  Constitutional:      General: She is awake. She is not in acute distress.    Appearance: She is well-developed. She is not ill-appearing.  HENT:     Head: Normocephalic.     Right Ear: Hearing normal.     Left Ear: Hearing normal.  Eyes:     General: Lids are normal.        Right eye: No discharge.        Left eye: No discharge.     Conjunctiva/sclera: Conjunctivae normal.  Pulmonary:     Effort: Pulmonary effort is normal. No accessory muscle usage or respiratory distress.  Musculoskeletal:     Cervical back: Normal range of motion.  Neurological:     Mental Status: She is alert and oriented to person, place, and time.  Psychiatric:        Attention and Perception: Attention normal.        Mood and Affect: Mood normal.        Behavior: Behavior normal. Behavior is cooperative.        Thought Content: Thought content normal.        Judgment: Judgment normal.     Results for orders placed or performed in visit on 09/13/22  Urine Culture   Specimen: Urine   UR  Result Value Ref Range   Urine Culture, Routine Final report (A)    Organism ID, Bacteria Klebsiella pneumoniae (A)    Antimicrobial Susceptibility Comment   Microscopic Examination   Urine  Result Value Ref Range   WBC, UA 6-10 (A) 0 - 5 /hpf   RBC, Urine 3-10 (A) 0 - 2 /hpf   Epithelial Cells (non renal) 0-10 0 - 10 /hpf   Bacteria, UA Few (A) None seen/Few  Urinalysis, Routine w reflex microscopic  Result Value Ref Range   Specific Gravity, UA >1.030  (H) 1.005 - 1.030   pH, UA 6.0 5.0 - 7.5   Color, UA  Yellow Yellow   Appearance Ur Cloudy (A) Clear   Leukocytes,UA 2+ (A) Negative   Protein,UA 2+ (A) Negative/Trace   Glucose, UA Negative Negative   Ketones, UA Trace (A) Negative   RBC, UA 3+ (A) Negative   Bilirubin, UA Negative Negative   Urobilinogen, Ur 0.2 0.2 - 1.0 mg/dL   Nitrite, UA Negative Negative   Microscopic Examination See below:       Assessment & Plan:   Problem List Items Addressed This Visit       Digestive   Irritable bowel syndrome with both constipation and diarrhea    Chronic, improving, with underlying anxiety.  Continue Bentyl as ordered and recommend she start Metamucil x 2 gummies daily to help regulate bowels.  Consider GI referral if ongoing.      Relevant Medications   dicyclomine (BENTYL) 10 MG capsule     Other   Generalized anxiety disorder - Primary    Chronic, ongoing since loss of grandson >3 years ago. Denies SI/HI. Has not tolerated SSRI medications.  Continues on Xanax BID. Discussed the risks of long term benzo use. Goal to decrease Xanax use in the future, she is attending therapy at this time which is offering benefit and may assist with Xanax reduction in future. UDS due next 08/26/23.  Return in 3 months.      Relevant Medications   traZODone (DESYREL) 100 MG tablet   ALPRAZolam (XANAX) 0.25 MG tablet (Start on 12/07/2022)   Insomnia    Chronic, ongoing for several years.  Continue current medication regimen to include Ambien and Trazodone, discussed risks of long term Ambien use and that with age will need to reduce dose. Continue good sleep hygiene.       Long term prescription benzodiazepine use    Refer to anxiety plan of care.  UDS due next 08/26/23.        Follow up plan: Return in about 3 months (around 02/23/2023) for ANXIETY -- refills.

## 2022-11-24 NOTE — Assessment & Plan Note (Signed)
Chronic, improving, with underlying anxiety.  Continue Bentyl as ordered and recommend she start Metamucil x 2 gummies daily to help regulate bowels.  Consider GI referral if ongoing.

## 2023-01-26 ENCOUNTER — Other Ambulatory Visit: Payer: Self-pay | Admitting: Nurse Practitioner

## 2023-01-26 NOTE — Telephone Encounter (Signed)
Reordered 11/24/22 #90 4 Refills and was sent to requesting pharmacy  Requested Prescriptions  Refused Prescriptions Disp Refills   traZODone (DESYREL) 100 MG tablet [Pharmacy Med Name: TRAZODONE 100 MG TABLET] 90 tablet 5    Sig: TAKE 1 TABLET BY MOUTH EVERYDAY AT BEDTIME     Psychiatry: Antidepressants - Serotonin Modulator Passed - 01/26/2023  8:31 AM      Passed - Valid encounter within last 6 months    Recent Outpatient Visits           2 months ago Generalized anxiety disorder   [place], [place] T, NP   4 months ago Urinary symptom or sign   [place], [place] T, NP   5 months ago Generalized anxiety disorder   [place], [place] T, NP   7 months ago Generalized anxiety disorder   [place], [place] T, NP   7 months ago Essential hypertension   [place], Barbaraann Faster, NP       Future Appointments             In 1 month Cannady, Barbaraann Faster, NP [place], [facility]

## 2023-02-26 NOTE — Patient Instructions (Signed)
Managing Anxiety, Adult After being diagnosed with anxiety, you may be relieved to know why you have felt or behaved a certain way. You may also feel overwhelmed about the treatment ahead and what it will mean for your life. With care and support, you can manage your anxiety. How to manage lifestyle changes Understanding the difference between stress and anxiety Although stress can play a role in anxiety, it is not the same as anxiety. Stress is your body's reaction to life changes and events, both good and bad. Stress is often caused by something external, such as a deadline, test, or competition. It normally goes away after the event has ended and will last just a few hours. But, stress can be ongoing and can lead to more than just stress. Anxiety is caused by something internal, such as imagining a terrible outcome or worrying that something will go wrong that will greatly upset you. Anxiety often does not go away even after the event is over, and it can become a long-term (chronic) worry. Lowering stress and anxiety Talk with your health care provider or a counselor to learn more about lowering anxiety and stress. They may suggest tension-reduction techniques, such as: Music. Spend time creating or listening to music that you enjoy and that inspires you. Mindfulness-based meditation. Practice being aware of your normal breaths while not trying to control your breathing. It can be done while sitting or walking. Centering prayer. Focus on a word, phrase, or sacred image that means something to you and brings you peace. Deep breathing. Expand your stomach and inhale slowly through your nose. Hold your breath for 3-5 seconds. Then breathe out slowly, letting your stomach muscles relax. Self-talk. Learn to notice and spot thought patterns that lead to anxiety reactions. Change those patterns to thoughts that feel peaceful. Muscle relaxation. Take time to tense muscles and then relax them. Choose a  tension-reduction technique that fits your lifestyle and personality. These techniques take time and practice. Set aside 5-15 minutes a day to do them. Specialized therapists can offer counseling and training in these techniques. The training to help with anxiety may be covered by some insurance plans. Other things you can do to manage stress and anxiety include: Keeping a stress diary. This can help you learn what triggers your reaction and then learn ways to manage your response. Thinking about how you react to certain situations. You may not be able to control everything, but you can control your response. Making time for activities that help you relax and not feeling guilty about spending your time in this way. Doing visual imagery. This involves imagining or creating mental pictures to help you relax. Practicing yoga. Through yoga poses, you can lower tension and relax.  Medicines Medicines for anxiety include: Antidepressant medicines. These are usually prescribed for long-term daily control. Anti-anxiety medicines. These may be added in severe cases, especially when panic attacks occur. When used together, medicines, psychotherapy, and tension-reduction techniques may be the most effective treatment. Relationships Relationships can play a big part in helping you recover. Spend more time connecting with trusted friends and family members. Think about going to couples counseling if you have a partner, taking family education classes, or going to family therapy. Therapy can help you and others better understand your anxiety. How to recognize changes in your anxiety Everyone responds differently to treatment for anxiety. Recovery from anxiety happens when symptoms lessen and stop interfering with your daily life at home or work. This may mean that you   will start to: Have better concentration and focus. Worry will interfere less in your daily thinking. Sleep better. Be less irritable. Have more  energy. Have improved memory. Try to recognize when your condition is getting worse. Contact your provider if your symptoms interfere with home or work and you feel like your condition is not improving. Follow these instructions at home: Activity Exercise. Adults should: Exercise for at least 150 minutes each week. The exercise should increase your heart rate and make you sweat (moderate-intensity exercise). Do strengthening exercises at least twice a week. Get the right amount and quality of sleep. Most adults need 7-9 hours of sleep each night. Lifestyle  Eat a healthy diet that includes plenty of vegetables, fruits, whole grains, low-fat dairy products, and lean protein. Do not eat a lot of foods that are high in fats, added sugars, or salt (sodium). Make choices that simplify your life. Do not use any products that contain nicotine or tobacco. These products include cigarettes, chewing tobacco, and vaping devices, such as e-cigarettes. If you need help quitting, ask your provider. Avoid caffeine, alcohol, and certain over-the-counter cold medicines. These may make you feel worse. Ask your pharmacist which medicines to avoid. General instructions Take over-the-counter and prescription medicines only as told by your provider. Keep all follow-up visits. This is to make sure you are managing your anxiety well or if you need more support. Where to find support You can get help and support from: Self-help groups. Online and community organizations. A trusted spiritual leader. Couples counseling. Family education classes. Family therapy. Where to find more information You may find that joining a support group helps you deal with your anxiety. The following sources can help you find counselors or support groups near you: Mental Health America: mentalhealthamerica.net Anxiety and Depression Association of America (ADAA): adaa.org National Alliance on Mental Illness (NAMI): nami.org Contact  a health care provider if: You have a hard time staying focused or finishing tasks. You spend many hours a day feeling worried about everyday life. You are very tired because you cannot stop worrying. You start to have headaches or often feel tense. You have chronic nausea or diarrhea. Get help right away if: Your heart feels like it is racing. You have shortness of breath. You have thoughts of hurting yourself or others. Get help right away if you feel like you may hurt yourself or others, or have thoughts about taking your own life. Go to your nearest emergency room or: Call 911. Call the National Suicide Prevention Lifeline at 1-800-273-8255 or 988. This is open 24 hours a day. Text the Crisis Text Line at 741741. This information is not intended to replace advice given to you by your health care provider. Make sure you discuss any questions you have with your health care provider. Document Revised: 08/31/2022 Document Reviewed: 03/15/2021 Elsevier Patient Education  2023 Elsevier Inc.  

## 2023-03-02 ENCOUNTER — Encounter: Payer: Self-pay | Admitting: Nurse Practitioner

## 2023-03-02 ENCOUNTER — Ambulatory Visit: Payer: 59 | Admitting: Nurse Practitioner

## 2023-03-02 ENCOUNTER — Telehealth: Payer: Self-pay

## 2023-03-02 VITALS — BP 133/84 | HR 57 | Temp 97.6°F | Ht 67.24 in | Wt 136.8 lb

## 2023-03-02 DIAGNOSIS — Z79899 Other long term (current) drug therapy: Secondary | ICD-10-CM

## 2023-03-02 DIAGNOSIS — F411 Generalized anxiety disorder: Secondary | ICD-10-CM

## 2023-03-02 DIAGNOSIS — F5104 Psychophysiologic insomnia: Secondary | ICD-10-CM

## 2023-03-02 NOTE — Assessment & Plan Note (Signed)
Refer to anxiety plan of care.  UDS due next 08/26/23. 

## 2023-03-02 NOTE — Telephone Encounter (Signed)
Patient made aware and verbalized understanding.

## 2023-03-02 NOTE — Assessment & Plan Note (Signed)
Chronic, stable, since loss of grandson >3 years ago. Denies SI/HI. Has not tolerated SSRI medications.  Continues on Xanax BID, but is reducing and currently taking once a day in morning with plan to continue reduction and come off medication in or around April. Discussed the risks of long term benzo use.  UDS due next 08/26/23.  Return in 3 months.

## 2023-03-02 NOTE — Telephone Encounter (Signed)
-----   Message from Venita Lick, NP sent at 03/02/2023 11:01 AM EDT ----- Was given only 15 tablets Ambien at recent fill, always gets 58 because she takes nightly -- pharmacy told her to tell provider to reach out to insurance and alert them.  Changed to [place] and this happened.  She should get 90 day supply as takes nightly, she has always gotten this even before coming to me. Can we call insurance and alert them and see what we need to do.

## 2023-03-02 NOTE — Assessment & Plan Note (Signed)
Chronic, ongoing for several years.  Continue current medication regimen to include Ambien and Trazodone, discussed risks of long term Ambien use and that with age will need to reduce dose. Continue good sleep hygiene.  

## 2023-03-02 NOTE — Telephone Encounter (Signed)
Called [employer] about patients medication and why she is only getting 15 tabs instead of at least 30 since she takes nightly. When I was talking to agent he informed me that he could not give me this information because Marnee Guarneri is out of network for this patient and it was not an emergent issue.

## 2023-03-02 NOTE — Progress Notes (Signed)
BP 133/84   Pulse (!) 57   Temp 97.6 F (36.4 C) (Oral)   Ht 5' 7.24" (1.708 m)   Wt 136 lb 12.8 oz (62.1 kg)   SpO2 98%   BMI 21.27 kg/m    Subjective:    Patient ID: Lori May, female    DOB: [birth year], 61 y.o.   MRN: [id number]  HPI: Lori May is a 62 y.o. female  Chief Complaint  Patient presents with   Anxiety   ANXIETY/STRESS Presents for mood follow-up. Lost grandson >3 years ago at young age to brain tumor, was placed on Xanax.  Tried different medications -- Zoloft and some others in past -- did not tolerate.  Pt made aware of risks of benzo medication use to include increased sedation, respiratory suppression, falls, dependence and cardiovascular events.  Pt would like to continue treatment as benefit determined to outweigh risk.  She currently is pretty much off the Xanax at night -- is slowly reducing, plans to be off completely in April.  Not attending therapy at this time, overall is feeling much better.    Taken Ambien and Trazodone for insomnia, taken for > 10 years.  Discussed at length risks of long term Ambien use and that when age 67 will need to lower dose to 5 MG.  Last Ambien fill on PDMP review was 02/15/23 (#15 tablets) and Xanax 02/16/23 (#60 tablets).  Was given only 15 tablets Ambien at recent fill, always gets 90 because she takes nightly -- pharmacy told her to tell provider to reach out to insurance and alert them. Duration: controlled Anxious mood: improving Excessive worrying: improving Irritability: no  Sweating: no Nausea: no Palpitations:no Hyperventilation: no Panic attacks: no Agoraphobia: no  Obscessions/compulsions: no Depressed mood: no    04/01/23   10:33 AM 11/24/2022   10:14 AM 09/13/2022    3:26 PM 08/25/2022    9:54 AM 06/29/2022    9:00 AM  Depression screen PHQ 2/9  Decreased Interest 0 0 0 0 0  Down, Depressed, Hopeless 0 0 0 0 1  PHQ - 2 Score 0 0 0 0 1  Altered sleeping 0 0 0 0 0  Tired, decreased energy 0 0 0  0 0  Change in appetite 0 0 0 0 0  Feeling bad or failure about yourself  0 0 0 0 0  Trouble concentrating 0 0 0 0 0  Moving slowly or fidgety/restless 0 0 0 0 0  Suicidal thoughts 0 0 0 0 0  PHQ-9 Score 0 0 0 0 1  Difficult doing work/chores Not difficult at all Not difficult at all Not difficult at all Not difficult at all Not difficult at all  Anhedonia: no Weight changes: no Insomnia: no Hypersomnia: no Fatigue/loss of energy: no Feelings of worthlessness: no Feelings of guilt: no Impaired concentration/indecisiveness: no Suicidal ideations: no  Crying spells: no Recent Stressors/Life Changes: no   Relationship problems: no   Family stress: no     Financial stress: no    Job stress: no    Recent death/loss: no     01-Apr-2023   10:33 AM 11/24/2022   10:14 AM 09/13/2022    3:26 PM 08/25/2022    9:54 AM  GAD 7 : Generalized Anxiety Score  Nervous, Anxious, on Edge 0 0 0 0  Control/stop worrying 0 0 0 0  Worry too much - different things 0 0 0 0  Trouble relaxing 0 0 0 0  Restless 0  0 0 0  Easily annoyed or irritable 0 0 0 0  Afraid - awful might happen 0 0 0 0  Total GAD 7 Score 0 0 0 0  Anxiety Difficulty Not difficult at all Not difficult at all Not difficult at all Not difficult at all     Relevant past medical, surgical, family and social history reviewed and updated as indicated. Interim medical history since our last visit reviewed. Allergies and medications reviewed and updated.  Review of Systems  Constitutional:  Negative for activity change, appetite change, diaphoresis, fatigue and fever.  Respiratory:  Negative for cough, chest tightness and shortness of breath.   Cardiovascular:  Negative for chest pain, palpitations and leg swelling.  Gastrointestinal: Negative.   Neurological: Negative.   Psychiatric/Behavioral:  Positive for sleep disturbance. Negative for decreased concentration, self-injury and suicidal ideas. The patient is not nervous/anxious.      Per HPI unless specifically indicated above     Objective:    BP 133/84   Pulse (!) 57   Temp 97.6 F (36.4 C) (Oral)   Ht 5' 7.24" (1.708 m)   Wt 136 lb 12.8 oz (62.1 kg)   SpO2 98%   BMI 21.27 kg/m   Wt Readings from Last 3 Encounters:  03/02/23 136 lb 12.8 oz (62.1 kg)  11/24/22 139 lb 4.8 oz (63.2 kg)  09/13/22 138 lb (62.6 kg)    Physical Exam Vitals and nursing note reviewed.  Constitutional:      General: She is awake. She is not in acute distress.    Appearance: She is well-developed. She is not ill-appearing.  HENT:     Head: Normocephalic.     Right Ear: Hearing normal.     Left Ear: Hearing normal.  Eyes:     General: Lids are normal.        Right eye: No discharge.        Left eye: No discharge.     Conjunctiva/sclera: Conjunctivae normal.  Pulmonary:     Effort: Pulmonary effort is normal. No accessory muscle usage or respiratory distress.  Musculoskeletal:     Cervical back: Normal range of motion.  Neurological:     Mental Status: She is alert and oriented to person, place, and time.  Psychiatric:        Attention and Perception: Attention normal.        Mood and Affect: Mood normal.        Behavior: Behavior normal. Behavior is cooperative.        Thought Content: Thought content normal.        Judgment: Judgment normal.     Results for orders placed or performed in visit on 09/13/22  Urine Culture   Specimen: Urine   UR  Result Value Ref Range   Urine Culture, Routine Final report (A)    Organism ID, Bacteria Klebsiella pneumoniae (A)    Antimicrobial Susceptibility Comment   Microscopic Examination   Urine  Result Value Ref Range   WBC, UA 6-10 (A) 0 - 5 /hpf   RBC, Urine 3-10 (A) 0 - 2 /hpf   Epithelial Cells (non renal) 0-10 0 - 10 /hpf   Bacteria, UA Few (A) None seen/Few  Urinalysis, Routine w reflex microscopic  Result Value Ref Range   Specific Gravity, UA >1.030 (H) 1.005 - 1.030   pH, UA 6.0 5.0 - 7.5   Color, UA  Yellow Yellow   Appearance Ur Cloudy (A) Clear   Leukocytes,UA 2+ (A)  Negative   Protein,UA 2+ (A) Negative/Trace   Glucose, UA Negative Negative   Ketones, UA Trace (A) Negative   RBC, UA 3+ (A) Negative   Bilirubin, UA Negative Negative   Urobilinogen, Ur 0.2 0.2 - 1.0 mg/dL   Nitrite, UA Negative Negative   Microscopic Examination See below:       Assessment & Plan:   Problem List Items Addressed This Visit       Other   Generalized anxiety disorder - Primary    Chronic, stable, since loss of grandson >3 years ago. Denies SI/HI. Has not tolerated SSRI medications.  Continues on Xanax BID, but is reducing and currently taking once a day in morning with plan to continue reduction and come off medication in or around April. Discussed the risks of long term benzo use.  UDS due next 08/26/23.  Return in 3 months.      Insomnia    Chronic, ongoing for several years.  Continue current medication regimen to include Ambien and Trazodone, discussed risks of long term Ambien use and that with age will need to reduce dose. Continue good sleep hygiene.       Long term prescription benzodiazepine use    Refer to anxiety plan of care.  UDS due next 08/26/23.        Follow up plan: Return in about 3 months (around 06/02/2023) for [place].

## 2023-03-03 ENCOUNTER — Other Ambulatory Visit: Payer: Self-pay | Admitting: Nurse Practitioner

## 2023-03-03 NOTE — Telephone Encounter (Signed)
Copied from [place] [phone]. Topic: General - Other >> Mar 03, 2023  8:25 AM Everette C wrote: Reason for CRM: Medication Refill - Medication: zolpidem (AMBIEN) 10 MG tablet [id number]  Has the patient contacted their pharmacy? Yes.   (Agent: If no, request that the patient contact the pharmacy for the refill. If patient does not wish to contact the pharmacy document the reason why and proceed with request.) (Agent: If yes, when and what did the pharmacy advise?)  Preferred Pharmacy (with phone number or street name): [facility] [id number], [place] [address] Phone: [phone] Fax: [phone] Hours: Not open 24 hours   Has the patient been seen for an appointment in the last year OR does the patient have an upcoming appointment? Yes.    Agent: Please be advised that RX refills may take up to 3 business days. We ask that you follow-up with your pharmacy.

## 2023-03-03 NOTE — Telephone Encounter (Signed)
Requested medication (s) are due for refill today: yes  Requested medication (s) are on the active medication list: yes  Last refill:  11/24/22  Future visit scheduled: yes  Notes to clinic:  Unable to refill per protocol, cannot delegate.      Requested Prescriptions  Pending Prescriptions Disp Refills   zolpidem (AMBIEN) 10 MG tablet 90 tablet 0    Sig: Take 1 tablet (10 mg total) by mouth at bedtime.     Not Delegated - Psychiatry:  Anxiolytics/Hypnotics Failed - 03/03/2023 10:27 AM      Failed - This refill cannot be delegated      Passed - Urine Drug Screen completed in last 360 days      Passed - Valid encounter within last 6 months    Recent Outpatient Visits           Yesterday Generalized anxiety disorder   [place], Henrine Screws T, NP   3 months ago Generalized anxiety disorder   [place], [place] T, NP   5 months ago Urinary symptom or sign   [place], Henrine Screws T, NP   6 months ago Generalized anxiety disorder   [place], Patrick T, NP   8 months ago Generalized anxiety disorder   [place], Barbaraann Faster, NP       Future Appointments             In 3 months Cannady, Barbaraann Faster, NP [place], [facility]

## 2023-03-04 MED ORDER — ZOLPIDEM TARTRATE 10 MG PO TABS: 10.0000 mg | ORAL_TABLET | Freq: Every day | ORAL | 0 refills | Status: DC

## 2023-04-19 ENCOUNTER — Other Ambulatory Visit: Payer: Self-pay | Admitting: Nurse Practitioner

## 2023-04-19 NOTE — Telephone Encounter (Signed)
Requested medication (s) are due for refill today: yes  Requested medication (s) are on the active medication list:yes   Last refill:  11/24/22 #180  Future visit scheduled:yes  Notes to clinic:  med not delegated to NT to RF   Requested Prescriptions  Pending Prescriptions Disp Refills   ALPRAZolam (XANAX) 0.25 MG tablet [Pharmacy Med Name: ALPRAZOLAM 0.25 MG TABLET] 60 tablet 2    Sig: TAKE 1 TABLET BY MOUTH 2 TIMES DAILY.     Not Delegated - Psychiatry: Anxiolytics/Hypnotics 2 Failed - 04/19/2023  1:25 PM      Failed - This refill cannot be delegated      Passed - Urine Drug Screen completed in last 360 days      Passed - Patient is not pregnant      Passed - Valid encounter within last 6 months    Recent Outpatient Visits           1 month ago Generalized anxiety disorder   [place] [facility] [place], Marrero T, NP   4 months ago Generalized anxiety disorder   [place] [facility] [place], [place] T, NP   7 months ago Urinary symptom or sign   [place] [facility] [place], [place] T, NP   7 months ago Generalized anxiety disorder   [place] [facility] [place], [place] T, NP   9 months ago Generalized anxiety disorder   [place] [facility] [place], Dorie Rank, NP       Future Appointments             In 1 month Cannady, Dorie Rank, NP [place] [facility]

## 2023-05-30 ENCOUNTER — Other Ambulatory Visit: Payer: Self-pay | Admitting: Nurse Practitioner

## 2023-05-31 NOTE — Telephone Encounter (Signed)
Requested medications are due for refill today.  yes  Requested medications are on the active medications list.  yes  Last refill. 03/04/2023  Future visit scheduled.   yes  Notes to clinic.  Refill not delegated.    Requested Prescriptions  Pending Prescriptions Disp Refills   zolpidem (AMBIEN) 10 MG tablet [Pharmacy Med Name: ZOLPIDEM TARTRATE 10 MG TABLET] 90 tablet     Sig: TAKE 1 TABLET BY MOUTH AT BEDTIME     Not Delegated - Psychiatry:  Anxiolytics/Hypnotics Failed - 05/30/2023  3:48 PM      Failed - This refill cannot be delegated      Passed - Urine Drug Screen completed in last 360 days      Passed - Valid encounter within last 6 months    Recent Outpatient Visits           3 months ago Generalized anxiety disorder   [place] [facility] [place], Corrie Dandy T, NP   6 months ago Generalized anxiety disorder   [place] [facility] [place], Marion T, NP   8 months ago Urinary symptom or sign   [place] [facility] [place], [place] T, NP   9 months ago Generalized anxiety disorder   [place] [facility] [place], [place] T, NP   11 months ago Generalized anxiety disorder   [place] [facility] [place], Dorie Rank, NP       Future Appointments             Tomorrow Marjie Skiff, NP [facility] [facility], [facility]

## 2023-06-01 ENCOUNTER — Telehealth: Payer: Self-pay | Admitting: Nurse Practitioner

## 2023-06-01 ENCOUNTER — Ambulatory Visit: Payer: 59 | Admitting: Nurse Practitioner

## 2023-06-01 ENCOUNTER — Other Ambulatory Visit: Payer: Self-pay | Admitting: Nurse Practitioner

## 2023-06-01 DIAGNOSIS — I10 Essential (primary) hypertension: Secondary | ICD-10-CM

## 2023-06-01 DIAGNOSIS — Z79899 Other long term (current) drug therapy: Secondary | ICD-10-CM

## 2023-06-01 DIAGNOSIS — F5104 Psychophysiologic insomnia: Secondary | ICD-10-CM

## 2023-06-01 DIAGNOSIS — E782 Mixed hyperlipidemia: Secondary | ICD-10-CM

## 2023-06-01 DIAGNOSIS — Z1211 Encounter for screening for malignant neoplasm of colon: Secondary | ICD-10-CM

## 2023-06-01 DIAGNOSIS — F411 Generalized anxiety disorder: Secondary | ICD-10-CM

## 2023-06-01 DIAGNOSIS — Z124 Encounter for screening for malignant neoplasm of cervix: Secondary | ICD-10-CM

## 2023-06-01 NOTE — Telephone Encounter (Signed)
Called patient to inform her that her provider was not in the office dur to an emergency. She rescheduled her 6/26 appt  to 7/16. Patient stated that she will need more medication zolpiden (ambien) 10 mg Tablet Please advise.

## 2023-06-02 NOTE — Telephone Encounter (Signed)
Labs in date.    Has appt in July 2024.  Refill given. Requested Prescriptions  Pending Prescriptions Disp Refills   rosuvastatin (CRESTOR) 10 MG tablet [Pharmacy Med Name: ROSUVASTATIN CALCIUM 10 MG TAB] 90 tablet 4    Sig: TAKE 1 TABLET BY MOUTH DAILY     Cardiovascular:  Antilipid - Statins 2 Failed - 06/01/2023  6:21 AM      Failed - Lipid Panel in normal range within the last 12 months    Cholesterol, Total  Date Value Ref Range Status  08/25/2022 188 100 - 199 mg/dL Final   LDL Chol Calc (NIH)  Date Value Ref Range Status  08/25/2022 88 0 - 99 mg/dL Final   HDL  Date Value Ref Range Status  08/25/2022 85 >39 mg/dL Final   Triglycerides  Date Value Ref Range Status  08/25/2022 82 0 - 149 mg/dL Final         Passed - Cr in normal range and within 360 days    Creatinine  Date Value Ref Range Status  08/25/2022 121.4 20.0 - 300.0 mg/dL Final   Creatinine, Ser  Date Value Ref Range Status  08/25/2022 0.79 0.57 - 1.00 mg/dL Final         Passed - Patient is not pregnant      Passed - Valid encounter within last 12 months    Recent Outpatient Visits           3 months ago Generalized anxiety disorder   [place] [facility] [place], Corrie Dandy T, NP   6 months ago Generalized anxiety disorder   [place] [facility] [place], [place] T, NP   8 months ago Urinary symptom or sign   [place] [facility] [place], [place] T, NP   9 months ago Generalized anxiety disorder   [place] [facility] [place], [place] T, NP   11 months ago Generalized anxiety disorder   [place] [facility] [place], Dorie Rank, NP       Future Appointments             In 2 weeks Cannady, Dorie Rank, NP [place] [facility], [facility]

## 2023-06-18 NOTE — Patient Instructions (Signed)
Be Involved in Caring For Your Health:  Taking Medications When medications are taken as directed, they can greatly improve your health. But if they are not taken as prescribed, they may not work. In some cases, not taking them correctly can be harmful. To help ensure your treatment remains effective and safe, understand your medications and how to take them. Bring your medications to each visit for review by your provider.  Your lab results, notes, and after visit summary will be available on My Chart. We strongly encourage you to use this feature. If lab results are abnormal the clinic will contact you with the appropriate steps. If the clinic does not contact you assume the results are satisfactory. You can always view your results on My Chart. If you have questions regarding your health or results, please contact the clinic during office hours. You can also ask questions on My Chart.  We at [facility] are grateful that you chose [place] to provide your care. We strive to provide evidence-based and compassionate care and are always looking for feedback. If you get a survey from the clinic please complete this so we can hear your opinions.  Generalized Anxiety Disorder, Adult Generalized anxiety disorder (GAD) is a mental health condition. Unlike normal worries, anxiety related to GAD is not triggered by a specific event. These worries do not fade or get better with time. GAD interferes with relationships, work, and school. GAD symptoms can vary from mild to severe. People with severe GAD can have intense waves of anxiety with physical symptoms that are similar to panic attacks. What are the causes? The exact cause of GAD is not known, but the following are believed to have an impact: Differences in natural brain chemicals. Genes passed down from parents to children. Differences in the way threats are perceived. Development and stress during childhood. Personality. What increases the  risk? The following factors may make you more likely to develop this condition: Being female. Having a family history of anxiety disorders. Being very shy. Experiencing very stressful life events, such as the death of a loved one. Having a very stressful family environment. What are the signs or symptoms? People with GAD often worry excessively about many things in their lives, such as their health and family. Symptoms may also include: Mental and emotional symptoms: Worrying excessively about natural disasters. Fear of being late. Difficulty concentrating. Fears that others are judging your performance. Physical symptoms: Fatigue. Headaches, muscle tension, muscle twitches, trembling, or feeling shaky. Feeling like your heart is pounding or beating very fast. Feeling out of breath or like you cannot take a deep breath. Having trouble falling asleep or staying asleep, or experiencing restlessness. Sweating. Nausea, diarrhea, or irritable bowel syndrome (IBS). Behavioral symptoms: Experiencing erratic moods or irritability. Avoidance of new situations. Avoidance of people. Extreme difficulty making decisions. How is this diagnosed? This condition is diagnosed based on your symptoms and medical history. You will also have a physical exam. Your health care provider may perform tests to rule out other possible causes of your symptoms. To be diagnosed with GAD, a person must have anxiety that: Is out of his or her control. Affects several different aspects of his or her life, such as work and relationships. Causes distress that makes him or her unable to take part in normal activities. Includes at least three symptoms of GAD, such as restlessness, fatigue, trouble concentrating, irritability, muscle tension, or sleep problems. Before your health care provider can confirm a diagnosis of  GAD, these symptoms must be present more days than they are not, and they must last for 6 months or  longer. How is this treated? This condition may be treated with: Medicine. Antidepressant medicine is usually prescribed for long-term daily control. Anti-anxiety medicines may be added in severe cases, especially when panic attacks occur. Talk therapy (psychotherapy). Certain types of talk therapy can be helpful in treating GAD by providing support, education, and guidance. Options include: Cognitive behavioral therapy (CBT). People learn coping skills and self-calming techniques to ease their physical symptoms. They learn to identify unrealistic thoughts and behaviors and to replace them with more appropriate thoughts and behaviors. Acceptance and commitment therapy (ACT). This treatment teaches people how to be mindful as a way to cope with unwanted thoughts and feelings. Biofeedback. This process trains you to manage your body's response (physiological response) through breathing techniques and relaxation methods. You will work with a therapist while machines are used to monitor your physical symptoms. Stress management techniques. These include yoga, meditation, and exercise. A mental health specialist can help determine which treatment is best for you. Some people see improvement with one type of therapy. However, other people require a combination of therapies. Follow these instructions at home: Lifestyle Maintain a consistent routine and schedule. Anticipate stressful situations. Create a plan and allow extra time to work with your plan. Practice stress management or self-calming techniques that you have learned from your therapist or your health care provider. Exercise regularly and spend time outdoors. Eat a healthy diet that includes plenty of vegetables, fruits, whole grains, low-fat dairy products, and lean protein. Do not eat a lot of foods that are high in fat, added sugar, or salt (sodium). Drink plenty of water. Avoid alcohol. Alcohol can increase anxiety. Avoid caffeine and  certain over-the-counter cold medicines. These may make you feel worse. Ask your pharmacist which medicines to avoid. General instructions Take over-the-counter and prescription medicines only as told by your health care provider. Understand that you are likely to have setbacks. Accept this and be kind to yourself as you persist to take better care of yourself. Anticipate stressful situations. Create a plan and allow extra time to work with your plan. Recognize and accept your accomplishments, even if you judge them as small. Spend time with people who care about you. Keep all follow-up visits. This is important. Where to find more information [employer] of Mental Health: http://www.maynard.net/ Substance Abuse and Mental Health Services: SkateOasis.com.pt Contact a health care provider if: Your symptoms do not get better. Your symptoms get worse. You have signs of depression, such as: A persistently sad or irritable mood. Loss of enjoyment in activities that used to bring you joy. Change in weight or eating. Changes in sleeping habits. Get help right away if: You have thoughts about hurting yourself or others. If you ever feel like you may hurt yourself or others, or have thoughts about taking your own life, get help right away. Go to your nearest emergency department or: Call your local emergency services (911 in the [place] Call a suicide crisis helpline, such as the National Suicide Prevention Lifeline at 660-655-3312 or 988 in the [place] This is open 24 hours a day in the [place] Text the Crisis Text Line at 310-424-0023 (in the [place] Summary Generalized anxiety disorder (GAD) is a mental health condition that involves worry that is not triggered by a specific event. People with GAD often worry excessively about many things in their lives, such as their health and  family. GAD may cause symptoms such as restlessness, trouble concentrating, sleep problems, frequent sweating, nausea, diarrhea,  headaches, and trembling or muscle twitching. A mental health specialist can help determine which treatment is best for you. Some people see improvement with one type of therapy. However, other people require a combination of therapies. This information is not intended to replace advice given to you by your health care provider. Make sure you discuss any questions you have with your health care provider. Document Revised: 06/17/2021 Document Reviewed: 03/15/2021 Elsevier Patient Education  2024 [employer].

## 2023-06-21 ENCOUNTER — Ambulatory Visit: Payer: 59 | Admitting: Nurse Practitioner

## 2023-06-21 ENCOUNTER — Encounter: Payer: Self-pay | Admitting: Nurse Practitioner

## 2023-06-21 VITALS — BP 108/81 | HR 58 | Temp 97.6°F | Ht 67.24 in | Wt 138.6 lb

## 2023-06-21 DIAGNOSIS — Z1231 Encounter for screening mammogram for malignant neoplasm of breast: Secondary | ICD-10-CM | POA: Diagnosis not present

## 2023-06-21 DIAGNOSIS — F411 Generalized anxiety disorder: Secondary | ICD-10-CM

## 2023-06-21 DIAGNOSIS — I10 Essential (primary) hypertension: Secondary | ICD-10-CM

## 2023-06-21 DIAGNOSIS — E782 Mixed hyperlipidemia: Secondary | ICD-10-CM | POA: Diagnosis not present

## 2023-06-21 DIAGNOSIS — F5104 Psychophysiologic insomnia: Secondary | ICD-10-CM | POA: Diagnosis not present

## 2023-06-21 DIAGNOSIS — Z0000 Encounter for general adult medical examination without abnormal findings: Secondary | ICD-10-CM | POA: Diagnosis not present

## 2023-06-21 DIAGNOSIS — Z79899 Other long term (current) drug therapy: Secondary | ICD-10-CM | POA: Diagnosis not present

## 2023-06-21 DIAGNOSIS — Z124 Encounter for screening for malignant neoplasm of cervix: Secondary | ICD-10-CM

## 2023-06-21 DIAGNOSIS — Z1211 Encounter for screening for malignant neoplasm of colon: Secondary | ICD-10-CM

## 2023-06-21 DIAGNOSIS — Z853 Personal history of malignant neoplasm of breast: Secondary | ICD-10-CM

## 2023-06-21 MED ORDER — LISINOPRIL 10 MG PO TABS: 10.0000 mg | ORAL_TABLET | Freq: Every morning | ORAL | 4 refills | Status: DC

## 2023-06-21 MED ORDER — ROSUVASTATIN CALCIUM 10 MG PO TABS: 10.0000 mg | ORAL_TABLET | Freq: Every day | ORAL | 4 refills | Status: DC

## 2023-06-21 MED ORDER — TRAZODONE HCL 100 MG PO TABS: 100.0000 mg | ORAL_TABLET | Freq: Every day | ORAL | 4 refills | Status: DC

## 2023-06-21 MED ORDER — ALPRAZOLAM 0.25 MG PO TABS: 0.2500 mg | ORAL_TABLET | Freq: Two times a day (BID) | ORAL | 2 refills | Status: DC

## 2023-06-21 NOTE — Assessment & Plan Note (Signed)
Chronic, ongoing for several years.  Continue current medication regimen to include Ambien and Trazodone, discussed risks of long term Ambien use and that with age will need to reduce dose. Continue good sleep hygiene.  She has stopped Xanax at night.

## 2023-06-21 NOTE — Assessment & Plan Note (Signed)
Chronic, ongoing.  Continue Crestor as is tolerating well without ADR.  Lipid panel today.

## 2023-06-21 NOTE — Progress Notes (Signed)
BP 108/81   Pulse (!) 58   Temp 97.6 F (36.4 C) (Oral)   Ht 5' 7.24" (1.708 m)   Wt 138 lb 9.6 oz (62.9 kg)   SpO2 100%   BMI 21.55 kg/m    Subjective:    Patient ID: Lori May, female    DOB: [birth year], 62 y.o.   MRN: [id number]  HPI: Lori May is a 62 y.o. female presenting on 06/21/2023 for comprehensive medical examination. Current medical complaints include: none  She currently lives with: husband Menopausal Symptoms: no   HYPERTENSION Currently taking Lisinopril daily and Rosuvastatin daily. Hypertension status: controlled  Satisfied with current treatment? yes Duration of hypertension: chronic BP monitoring frequency: not checking BP range:  BP medication side effects:  no Medication compliance: excellent compliance Aspirin: no Recurrent headaches: no Visual changes: no Palpitations: no Dyspnea: no Chest pain: no Lower extremity edema: no Dizzy/lightheaded: no  The 10-year ASCVD risk score (Arnett DK, et al., 2019) is: 3.3%   Values used to calculate the score:     Age: 83 years     Sex: Female     Is Non-Hispanic African American: No     Diabetic: No     Tobacco smoker: No     Systolic Blood Pressure: 108 mmHg     Is BP treated: Yes     HDL Cholesterol: 85 mg/dL     Total Cholesterol: 188 mg/dL  ANXIETY/STRESS Lost a grandson >3 years ago at young age to brain tumor, that is when she was placed on Xanax.  Tried different medications -- Zoloft and some others in past.  She stopped taking Zoloft because she wanted to trial without it. Pt made aware of risks of benzo medication use to include increased sedation, respiratory suppression, falls, dependence and cardiovascular events.  Pt would like to continue treatment as benefit determined to outweigh risk. Currently taking Xanax once a day, has weaned to once a day and taking Trazodone at night for rest.     Has taken Ambien and Trazodone for insomnia, taken for > 10 years.  Discussed at length  risks of long term Ambien use and that when age 47 will need to lower dose to 5 MG.  Last Ambien fill on PDMP review was 06/01/23 (#90 tablets) and Xanax 04/19/23 (#60 tablets).  Is going to [place] for 20 days upcoming.   Mood status: stable Satisfied with current treatment?: yes Symptom severity: mild  Duration of current treatment : chronic Side effects: no Medication compliance: fair compliance Psychotherapy/counseling: no in the past -- has not been in awhile Previous psychiatric medications: multiple medications Depressed mood: occasional Anxious mood: occasional Anhedonia: no Significant weight loss or gain: no Insomnia: no  Fatigue: no Feelings of worthlessness or guilt: no Impaired concentration/indecisiveness: no Suicidal ideations: no Hopelessness: no Crying spells: yes    06/21/2023   10:43 AM 03/02/2023   10:33 AM 11/24/2022   10:14 AM 09/13/2022    3:26 PM 08/25/2022    9:54 AM  Depression screen PHQ 2/9  Decreased Interest 0 0 0 0 0  Down, Depressed, Hopeless 0 0 0 0 0  PHQ - 2 Score 0 0 0 0 0  Altered sleeping 0 0 0 0 0  Tired, decreased energy 0 0 0 0 0  Change in appetite 0 0 0 0 0  Feeling bad or failure about yourself  0 0 0 0 0  Trouble concentrating 0 0 0 0 0  Moving slowly  or fidgety/restless 0 0 0 0 0  Suicidal thoughts 0 0 0 0 0  PHQ-9 Score 0 0 0 0 0  Difficult doing work/chores Not difficult at all Not difficult at all Not difficult at all Not difficult at all Not difficult at all       06/21/2023   10:44 AM 03/02/2023   10:33 AM 11/24/2022   10:14 AM 09/13/2022    3:26 PM  GAD 7 : Generalized Anxiety Score  Nervous, Anxious, on Edge 0 0 0 0  Control/stop worrying 0 0 0 0  Worry too much - different things 0 0 0 0  Trouble relaxing 0 0 0 0  Restless 0 0 0 0  Easily annoyed or irritable 0 0 0 0  Afraid - awful might happen 0 0 0 0  Total GAD 7 Score 0 0 0 0  Anxiety Difficulty Not difficult at all Not difficult at all Not difficult at all Not  difficult at all       08/25/2022    9:54 AM 09/13/2022    3:26 PM 11/24/2022   10:14 AM 03/02/2023   10:32 AM 06/21/2023   10:43 AM  Fall Risk  Falls in the past year? 0 0 0 0 0  Was there an injury with Fall? 0 0 0 0 0  Fall Risk Category Calculator 0 0 0 0 0  Fall Risk Category (Retired) Low Low Low    (RETIRED) Patient Fall Risk Level Low fall risk Low fall risk     Patient at Risk for Falls Due to No Fall Risks No Fall Risks No Fall Risks No Fall Risks No Fall Risks  Fall risk Follow up Falls evaluation completed Falls evaluation completed Falls evaluation completed Falls evaluation completed Falls evaluation completed    Functional Status Survey: Is the patient deaf or have difficulty hearing?: No Does the patient have difficulty seeing, even when wearing glasses/contacts?: No Does the patient have difficulty concentrating, remembering, or making decisions?: No Does the patient have difficulty walking or climbing stairs?: No Does the patient have difficulty dressing or bathing?: No Does the patient have difficulty doing errands alone such as visiting a doctor's office or shopping?: No   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   Cancer (HCC) 2014   right breast LCIS   Hypertension    Postmenopausal 2013    Surgical History:  Past Surgical History:  Procedure Laterality Date   BREAST BIOPSY Left 10/06/2015   ADH   BREAST EXCISIONAL BIOPSY Left 01/2016   lumpectomy to remove ADH   BREAST EXCISIONAL BIOPSY Right 10/26/2013   lumpectomy breast lobular carcinoma    BREAST LUMPECTOMY Right 2014   LCIS   BREAST LUMPECTOMY WITH NEEDLE LOCALIZATION Left 01/07/2016   Procedure: BREAST LUMPECTOMY WITH NEEDLE LOCALIZATION;  Surgeon: Gladis Riffle, MD;  Location: [facility] ORS;  Service: General;  Laterality: Left;   BREAST SURGERY Right 10/26/2013   Partial Mastectomy   NOVASURE  ABLATION  2007    Medications:  Current Outpatient Medications on File Prior to Visit  Medication Sig   acetaminophen (TYLENOL) 500 MG tablet Take 500 mg by mouth as needed.   dicyclomine (BENTYL) 10 MG capsule Take 1 capsule (10 mg total) by mouth 3 (three) times daily before meals.   zolpidem (AMBIEN) 10 MG tablet TAKE 1 TABLET BY MOUTH AT BEDTIME  No current facility-administered medications on file prior to visit.    Allergies:  No Known Allergies  Social History:  Social History   Socioeconomic History   Marital status: Married    Spouse name: Not on file   Number of children: Not on file   Years of education: Not on file   Highest education level: 12th grade  Occupational History   Not on file  Tobacco Use   Smoking status: Former    Current packs/day: 0.00    Types: Cigarettes    Start date: 06/09/2016    Quit date: 06/09/2016    Years since quitting: 7.0   Smokeless tobacco: Never   Tobacco comments:    ocassional smoker -- social smoker  Vaping Use   Vaping status: Never Used  Substance and Sexual Activity   Alcohol use: Yes    Alcohol/week: 0.0 standard drinks of alcohol    Comment: wine OCC   Drug use: No   Sexual activity: Yes  Other Topics Concern   Not on file  Social History Narrative   Not on file   Social Determinants of Health   Financial Resource Strain: Low Risk  (03/01/2023)   Overall Financial Resource Strain (CARDIA)    Difficulty of Paying Living Expenses: Not hard at all  Food Insecurity: No Food Insecurity (03/01/2023)   Hunger Vital Sign    Worried About Running Out of Food in the Last Year: Never true    Ran Out of Food in the Last Year: Never true  Transportation Needs: No Transportation Needs (03/01/2023)   PRAPARE - Administrator, Civil Service (Medical): No    Lack of Transportation (Non-Medical): No  Physical Activity: Sufficiently Active (03/01/2023)   Exercise Vital Sign    Days of Exercise per Week: 4 days     Minutes of Exercise per Session: 60 min  Stress: No Stress Concern Present (03/01/2023)   [employer] of Occupational Health - Occupational Stress Questionnaire    Feeling of Stress : Only a little  Social Connections: Unknown (03/01/2023)   Social Connection and Isolation Panel [NHANES]    Frequency of Communication with Friends and Family: Three times a week    Frequency of Social Gatherings with Friends and Family: Twice a week    Attends Religious Services: Patient declined    Database administrator or Organizations: Yes    Attends Engineer, structural: More than 4 times per year    Marital Status: Married  Catering manager Violence: Not At Risk (05/20/2021)   Humiliation, Afraid, Rape, and Kick questionnaire    Fear of Current or Ex-Partner: No    Emotionally Abused: No    Physically Abused: No    Sexually Abused: No   Social History   Tobacco Use  Smoking Status Former   Current packs/day: 0.00   Types: Cigarettes   Start date: 06/09/2016   Quit date: 06/09/2016   Years since quitting: 7.0  Smokeless Tobacco Never  Tobacco Comments   ocassional smoker -- social smoker   Social History   Substance and Sexual Activity  Alcohol Use Yes   Alcohol/week: 0.0 standard drinks of alcohol   Comment: wine OCC    Family History:  Family History  Problem Relation Age of Onset   Leukemia Mother 42   Heart disease Father    Hypertension Sister    Hypertension Brother    Hypertension Brother    Breast cancer Neg Hx  Past medical history, surgical history, medications, allergies, family history and social history reviewed with patient today and changes made to appropriate areas of the chart.   Review of Systems - negative All other ROS negative except what is listed above and in the HPI.      Objective:    BP 108/81   Pulse (!) 58   Temp 97.6 F (36.4 C) (Oral)   Ht 5' 7.24" (1.708 m)   Wt 138 lb 9.6 oz (62.9 kg)   SpO2 100%   BMI 21.55 kg/m   Wt  Readings from Last 3 Encounters:  06/21/23 138 lb 9.6 oz (62.9 kg)  03/02/23 136 lb 12.8 oz (62.1 kg)  11/24/22 139 lb 4.8 oz (63.2 kg)    Physical Exam Vitals and nursing note reviewed. Exam conducted with a chaperone present.  Constitutional:      General: She is awake. She is not in acute distress.    Appearance: She is well-developed and well-groomed. She is not ill-appearing or toxic-appearing.  HENT:     Head: Normocephalic and atraumatic.     Right Ear: Hearing, tympanic membrane, ear canal and external ear normal. No drainage.     Left Ear: Hearing, tympanic membrane, ear canal and external ear normal. No drainage.     Nose: Nose normal.     Right Sinus: No maxillary sinus tenderness or frontal sinus tenderness.     Left Sinus: No maxillary sinus tenderness or frontal sinus tenderness.     Mouth/Throat:     Mouth: Mucous membranes are moist.     Pharynx: Oropharynx is clear. Uvula midline. No pharyngeal swelling, oropharyngeal exudate or posterior oropharyngeal erythema.  Eyes:     General: Lids are normal.        Right eye: No discharge.        Left eye: No discharge.     Extraocular Movements: Extraocular movements intact.     Conjunctiva/sclera: Conjunctivae normal.     Pupils: Pupils are equal, round, and reactive to light.     Visual Fields: Right eye visual fields normal and left eye visual fields normal.  Neck:     Thyroid: No thyromegaly.     Vascular: No carotid bruit.     Trachea: Trachea normal.  Cardiovascular:     Rate and Rhythm: Normal rate and regular rhythm.     Heart sounds: Normal heart sounds. No murmur heard.    No gallop.  Pulmonary:     Effort: Pulmonary effort is normal. No accessory muscle usage or respiratory distress.     Breath sounds: Normal breath sounds.  Chest:  Breasts:    Right: Normal.     Left: Normal.  Abdominal:     General: Bowel sounds are normal.     Palpations: Abdomen is soft. There is no hepatomegaly or splenomegaly.      Tenderness: There is no abdominal tenderness.  Musculoskeletal:        General: Normal range of motion.     Cervical back: Normal range of motion and neck supple.     Right lower leg: No edema.     Left lower leg: No edema.  Lymphadenopathy:     Head:     Right side of head: No submental, submandibular, tonsillar, preauricular or posterior auricular adenopathy.     Left side of head: No submental, submandibular, tonsillar, preauricular or posterior auricular adenopathy.     Cervical: No cervical adenopathy.     Upper Body:  Right upper body: No supraclavicular, axillary or pectoral adenopathy.     Left upper body: No supraclavicular, axillary or pectoral adenopathy.  Skin:    General: Skin is warm and dry.     Capillary Refill: Capillary refill takes less than 2 seconds.     Findings: No rash.  Neurological:     Mental Status: She is alert and oriented to person, place, and time.     Gait: Gait is intact.     Deep Tendon Reflexes: Reflexes are normal and symmetric.     Reflex Scores:      Brachioradialis reflexes are 2+ on the right side and 2+ on the left side.      Patellar reflexes are 2+ on the right side and 2+ on the left side. Psychiatric:        Attention and Perception: Attention normal.        Mood and Affect: Mood normal.        Speech: Speech normal.        Behavior: Behavior normal. Behavior is cooperative.        Thought Content: Thought content normal.        Judgment: Judgment normal.    Results for orders placed or performed in visit on 09/13/22  Urine Culture   Specimen: Urine   UR  Result Value Ref Range   Urine Culture, Routine Final report (A)    Organism ID, Bacteria Klebsiella pneumoniae (A)    Antimicrobial Susceptibility Comment   Microscopic Examination   Urine  Result Value Ref Range   WBC, UA 6-10 (A) 0 - 5 /hpf   RBC, Urine 3-10 (A) 0 - 2 /hpf   Epithelial Cells (non renal) 0-10 0 - 10 /hpf   Bacteria, UA Few (A) None seen/Few   Urinalysis, Routine w reflex microscopic  Result Value Ref Range   Specific Gravity, UA >1.030 (H) 1.005 - 1.030   pH, UA 6.0 5.0 - 7.5   Color, UA Yellow Yellow   Appearance Ur Cloudy (A) Clear   Leukocytes,UA 2+ (A) Negative   Protein,UA 2+ (A) Negative/Trace   Glucose, UA Negative Negative   Ketones, UA Trace (A) Negative   RBC, UA 3+ (A) Negative   Bilirubin, UA Negative Negative   Urobilinogen, Ur 0.2 0.2 - 1.0 mg/dL   Nitrite, UA Negative Negative   Microscopic Examination See below:       Assessment & Plan:   Problem List Items Addressed This Visit       Cardiovascular and Mediastinum   Essential hypertension - Primary    Chronic, stable.  BP well below goal today.  Will maintain Lisinopril at this time for kidney protection, educated her on this and to alert provider if elevation in BP consistently at home >130/80.  Recommend she continue to monitor BP at home, monitoring a few days a week and documenting for provider.  DASH diet focus at home. LABS today: CMP, TSH, CBC.      Relevant Medications   rosuvastatin (CRESTOR) 10 MG tablet   lisinopril (ZESTRIL) 10 MG tablet   Other Relevant Orders   CBC with Differential/Platelet   Comprehensive metabolic panel   TSH     Other   Generalized anxiety disorder    Chronic, stable, since loss of grandson >3 years ago. Denies SI/HI. Has not tolerated SSRI medications.  Continues on Xanax BID, but is reducing and currently taking once a day in morning with plan to continue reduction. Discussed the risks  of long term benzo use.  UDS due next 08/26/23.  Return in 3 months.      Relevant Medications   traZODone (DESYREL) 100 MG tablet   ALPRAZolam (XANAX) 0.25 MG tablet   History of left breast cancer    Diagnostic mammogram ordered.  Continue annual screening.      Relevant Orders   MM 3D DIAGNOSTIC MAMMOGRAM BILATERAL BREAST   Hyperlipidemia, mixed    Chronic, ongoing.  Continue Crestor as is tolerating well without ADR.   Lipid panel today.      Relevant Medications   rosuvastatin (CRESTOR) 10 MG tablet   lisinopril (ZESTRIL) 10 MG tablet   Other Relevant Orders   Comprehensive metabolic panel   Lipid Panel w/o Chol/HDL Ratio   Insomnia    Chronic, ongoing for several years.  Continue current medication regimen to include Ambien and Trazodone, discussed risks of long term Ambien use and that with age will need to reduce dose. Continue good sleep hygiene.  She has stopped Xanax at night.      Long term prescription benzodiazepine use    Refer to anxiety plan of care.  UDS due next 08/26/23.      Other Visit Diagnoses     Colon cancer screening       Cologuard ordered today and discussed with patient.   Relevant Orders   Cologuard   Encounter for screening mammogram for malignant neoplasm of breast       Mammogram ordered today and instructed on how to schedule.   Relevant Orders   MM 3D DIAGNOSTIC MAMMOGRAM BILATERAL BREAST   Encounter for annual physical exam       Annual physical today with labs and health maintenance reviewed, discussed with patient.        Follow up plan: Return in about 3 months (around 09/21/2023) for ANXIETY -- UDS due.   LABORATORY TESTING:  - Pap smear: refuses  IMMUNIZATIONS:   - Tdap: Tetanus vaccination status reviewed: last tetanus booster within 10 years. - Influenza: Up to date - Pneumovax: Not applicable - Prevnar: Not applicable - HPV: Not applicable - Zostavax vaccine: Refused  SCREENING: -Mammogram: Ordered today -- due - Colonoscopy: Cologuard ordered today - Bone Density: Not applicable  -Hearing Test: Not applicable  -Spirometry: Not applicable   PATIENT COUNSELING:   Advised to take 1 mg of folate supplement per day if capable of pregnancy.   Sexuality: Discussed sexually transmitted diseases, partner selection, use of condoms, avoidance of unintended pregnancy  and contraceptive alternatives.   Advised to avoid cigarette smoking.  I  discussed with the patient that most people either abstain from alcohol or drink within safe limits (<=14/week and <=4 drinks/occasion for males, <=7/weeks and <= 3 drinks/occasion for females) and that the risk for alcohol disorders and other health effects rises proportionally with the number of drinks per week and how often a drinker exceeds daily limits.  Discussed cessation/primary prevention of drug use and availability of treatment for abuse.   Diet: Encouraged to adjust caloric intake to maintain  or achieve ideal body weight, to reduce intake of dietary saturated fat and total fat, to limit sodium intake by avoiding high sodium foods and not adding table salt, and to maintain adequate dietary potassium and calcium preferably from fresh fruits, vegetables, and low-fat dairy products.    Stressed the importance of regular exercise  Injury prevention: Discussed safety belts, safety helmets, smoke detector, smoking near bedding or upholstery.   Dental health: Discussed importance  of regular tooth brushing, flossing, and dental visits.    NEXT PREVENTATIVE PHYSICAL DUE IN 1 YEAR. Return in about 3 months (around 09/21/2023) for ANXIETY -- UDS due.

## 2023-06-21 NOTE — Assessment & Plan Note (Signed)
Chronic, stable, since loss of grandson >3 years ago. Denies SI/HI. Has not tolerated SSRI medications.  Continues on Xanax BID, but is reducing and currently taking once a day in morning with plan to continue reduction. Discussed the risks of long term benzo use.  UDS due next 08/26/23.  Return in 3 months.

## 2023-06-21 NOTE — Assessment & Plan Note (Signed)
Chronic, stable.  BP well below goal today.  Will maintain Lisinopril at this time for kidney protection, educated her on this and to alert provider if elevation in BP consistently at home >130/80.  Recommend she continue to monitor BP at home, monitoring a few days a week and documenting for provider.  DASH diet focus at home. LABS today: CMP, TSH, CBC.

## 2023-06-21 NOTE — Assessment & Plan Note (Signed)
Diagnostic mammogram ordered.  Continue annual screening.

## 2023-06-21 NOTE — Assessment & Plan Note (Signed)
 Refer to anxiety plan of care.  UDS due next 08/26/23.

## 2023-06-22 LAB — COMPREHENSIVE METABOLIC PANEL
ALT: 12 IU/L (ref 0–32)
AST: 19 IU/L (ref 0–40)
Albumin: 4.5 g/dL (ref 3.9–4.9)
Alkaline Phosphatase: 67 IU/L (ref 44–121)
BUN/Creatinine Ratio: 14 (ref 12–28)
BUN: 10 mg/dL (ref 8–27)
Bilirubin Total: 0.4 mg/dL (ref 0.0–1.2)
CO2: 23 mmol/L (ref 20–29)
Calcium: 9.7 mg/dL (ref 8.7–10.3)
Chloride: 101 mmol/L (ref 96–106)
Creatinine, Ser: 0.69 mg/dL (ref 0.57–1.00)
Globulin, Total: 2.1 g/dL (ref 1.5–4.5)
Glucose: 92 mg/dL (ref 70–99)
Potassium: 4.4 mmol/L (ref 3.5–5.2)
Sodium: 138 mmol/L (ref 134–144)
Total Protein: 6.6 g/dL (ref 6.0–8.5)
eGFR: 97 mL/min/{1.73_m2} (ref >59)

## 2023-06-22 LAB — CBC WITH DIFFERENTIAL/PLATELET
Basophils Absolute: 0 10*3/uL (ref 0.0–0.2)
Basos: 0 %
EOS (ABSOLUTE): 0.1 10*3/uL (ref 0.0–0.4)
Eos: 1 %
Hematocrit: 41.6 % (ref 34.0–46.6)
Hemoglobin: 14.3 g/dL (ref 11.1–15.9)
Immature Grans (Abs): 0 10*3/uL (ref 0.0–0.1)
Immature Granulocytes: 0 %
Lymphocytes Absolute: 1.2 10*3/uL (ref 0.7–3.1)
Lymphs: 20 %
MCH: 33.3 pg — ABNORMAL HIGH (ref 26.6–33.0)
MCHC: 34.4 g/dL (ref 31.5–35.7)
MCV: 97 fL (ref 79–97)
Monocytes Absolute: 0.5 10*3/uL (ref 0.1–0.9)
Monocytes: 8 %
Neutrophils Absolute: 4.2 10*3/uL (ref 1.4–7.0)
Neutrophils: 71 %
Platelets: 207 10*3/uL (ref 150–450)
RBC: 4.3 x10E6/uL (ref 3.77–5.28)
RDW: 11.8 % (ref 11.7–15.4)
WBC: 5.9 10*3/uL (ref 3.4–10.8)

## 2023-06-22 LAB — LIPID PANEL W/O CHOL/HDL RATIO
Cholesterol, Total: 163 mg/dL (ref 100–199)
HDL: 65 mg/dL (ref >39)
LDL Chol Calc (NIH): 77 mg/dL (ref 0–99)
Triglycerides: 117 mg/dL (ref 0–149)
VLDL Cholesterol Cal: 21 mg/dL (ref 5–40)

## 2023-06-22 LAB — TSH: TSH: 2.88 u[IU]/mL (ref 0.450–4.500)

## 2023-06-22 NOTE — Progress Notes (Signed)
Contacted via MyChart   Good afternoon Skylen, your labs have returned: - CBC shows no anemia or infection. - Kidney function, creatinine and eGFR, remains normal, as is liver function, AST and ALT.  - Everything else looks great!!  No changes needed.  Keep up the good work!! Any questions? Keep being stellar!!  Thank you for allowing me to participate in your care.  I appreciate you. Kindest regards, Renate Danh

## 2023-07-13 DIAGNOSIS — Z1211 Encounter for screening for malignant neoplasm of colon: Secondary | ICD-10-CM | POA: Diagnosis not present

## 2023-07-17 NOTE — Progress Notes (Signed)
Contacted via MyChart   Cologuard is negative!!  [employer]!!  Repeat in 3 years.

## 2023-08-02 ENCOUNTER — Ambulatory Visit
Admission: RE | Admit: 2023-08-02 | Discharge: 2023-08-02 | Disposition: A | Discharge status: Home / Self Care | Payer: 59 | Source: Ambulatory Visit | Attending: Nurse Practitioner | Admitting: Nurse Practitioner

## 2023-08-02 DIAGNOSIS — Z853 Personal history of malignant neoplasm of breast: Secondary | ICD-10-CM | POA: Insufficient documentation

## 2023-08-02 DIAGNOSIS — Z1231 Encounter for screening mammogram for malignant neoplasm of breast: Secondary | ICD-10-CM | POA: Diagnosis not present

## 2023-08-03 NOTE — Progress Notes (Signed)
Contacted via MyChart   Normal mammogram, may repeat in one year:)

## 2023-08-18 ENCOUNTER — Other Ambulatory Visit: Payer: Self-pay | Admitting: Nurse Practitioner

## 2023-08-18 NOTE — Telephone Encounter (Signed)
Requested medications are due for refill today.  A little too soon  Requested medications are on the active medications list.  yes  Last refill. 06/01/2023 #90 0 rf  Future visit scheduled.   yes  Notes to clinic.  Refill not delegated.    Requested Prescriptions  Pending Prescriptions Disp Refills   zolpidem (AMBIEN) 10 MG tablet 90 tablet 0    Sig: Take 1 tablet (10 mg total) by mouth at bedtime.     Not Delegated - Psychiatry:  Anxiolytics/Hypnotics Failed - 08/18/2023 11:17 AM      Failed - This refill cannot be delegated      Passed - Urine Drug Screen completed in last 360 days      Passed - Valid encounter within last 6 months    Recent Outpatient Visits           1 month ago Essential hypertension   [place] [facility] [place], [place]ger T, NP   5 months ago Generalized anxiety disorder   [place] [facility] [place], [place] T, NP   8 months ago Generalized anxiety disorder   [place] [facility] [place], [place] T, NP   11 months ago Urinary symptom or sign   [place] [facility] [place], Corrie Dandy T, NP   11 months ago Generalized anxiety disorder    [facility] [place], Dorie Rank, NP       Future Appointments             In 1 month [place], Dorie Rank, NP  [facility] [place]

## 2023-08-18 NOTE — Telephone Encounter (Signed)
Medication Refill - Medication:  zolpidem (AMBIEN) 10 MG tablet  *Patient will be going to [place] for 3 weeks, leaving on 08/24/2023 and needs this medication to take with her  Has the patient contacted their pharmacy? Yes, advised to contact PCP  Preferred Pharmacy (with phone number or street name):  [id number] [place], [place] - [address]  Phone: [phone]   Has the patient been seen for an appointment in the last year OR does the patient have an upcoming appointment? YES. F/u scheduled for 10.15.24 with PCP

## 2023-08-19 MED ORDER — ZOLPIDEM TARTRATE 10 MG PO TABS: 10.0000 mg | ORAL_TABLET | Freq: Every day | ORAL | 0 refills | Status: DC

## 2023-08-22 ENCOUNTER — Telehealth: Payer: Self-pay | Admitting: Nurse Practitioner

## 2023-08-22 MED ORDER — ZOLPIDEM TARTRATE 10 MG PO TABS: 10.0000 mg | ORAL_TABLET | Freq: Every day | ORAL | 0 refills | Status: DC

## 2023-08-22 NOTE — Telephone Encounter (Signed)
Contacted [facility]. Patient picked up 90 day supply of the medication today.

## 2023-08-22 NOTE — Telephone Encounter (Signed)
Pt is calling in because she needs for Jolene to call the pharmacy and approve her picking her medication zolpidem (AMBIEN) 10 MG tablet [[id number]] up early due to her taking a trip to [place] on Wednesday. Pt says she has spoken with Jolene about this already and Jolene said it was okay for her to get her medication early due to the trip. Please advise.

## 2023-09-18 NOTE — Patient Instructions (Signed)
Be Involved in Caring For Your Health:  Taking Medications When medications are taken as directed, they can greatly improve your health. But if they are not taken as prescribed, they may not work. In some cases, not taking them correctly can be harmful. To help ensure your treatment remains effective and safe, understand your medications and how to take them. Bring your medications to each visit for review by your provider.  Your lab results, notes, and after visit summary will be available on My Chart. We strongly encourage you to use this feature. If lab results are abnormal the clinic will contact you with the appropriate steps. If the clinic does not contact you assume the results are satisfactory. You can always view your results on My Chart. If you have questions regarding your health or results, please contact the clinic during office hours. You can also ask questions on My Chart.  We at [facility] are grateful that you chose [place] to provide your care. We strive to provide evidence-based and compassionate care and are always looking for feedback. If you get a survey from the clinic please complete this so we can hear your opinions.  Managing Anxiety, Adult After being diagnosed with anxiety, you may be relieved to know why you have felt or behaved a certain way. You may also feel overwhelmed about the treatment ahead and what it will mean for your life. With care and support, you can manage your anxiety. How to manage lifestyle changes Understanding the difference between stress and anxiety Although stress can play a role in anxiety, it is not the same as anxiety. Stress is your body's reaction to life changes and events, both good and bad. Stress is often caused by something external, such as a deadline, test, or competition. It normally goes away after the event has ended and will last just a few hours. But, stress can be ongoing and can lead to more than just stress. Anxiety is  caused by something internal, such as imagining a terrible outcome or worrying that something will go wrong that will greatly upset you. Anxiety often does not go away even after the event is over, and it can become a long-term (chronic) worry. Lowering stress and anxiety Talk with your health care provider or a counselor to learn more about lowering anxiety and stress. They may suggest tension-reduction techniques, such as: Music. Spend time creating or listening to music that you enjoy and that inspires you. Mindfulness-based meditation. Practice being aware of your normal breaths while not trying to control your breathing. It can be done while sitting or walking. Centering prayer. Focus on a word, phrase, or sacred image that means something to you and brings you peace. Deep breathing. Expand your stomach and inhale slowly through your nose. Hold your breath for 3-5 seconds. Then breathe out slowly, letting your stomach muscles relax. Self-talk. Learn to notice and spot thought patterns that lead to anxiety reactions. Change those patterns to thoughts that feel peaceful. Muscle relaxation. Take time to tense muscles and then relax them. Choose a tension-reduction technique that fits your lifestyle and personality. These techniques take time and practice. Set aside 5-15 minutes a day to do them. Specialized therapists can offer counseling and training in these techniques. The training to help with anxiety may be covered by some insurance plans. Other things you can do to manage stress and anxiety include: Keeping a stress diary. This can help you learn what triggers your reaction and then learn ways  to manage your response. Thinking about how you react to certain situations. You may not be able to control everything, but you can control your response. Making time for activities that help you relax and not feeling guilty about spending your time in this way. Doing visual imagery. This involves  imagining or creating mental pictures to help you relax. Practicing yoga. Through yoga poses, you can lower tension and relax.  Medicines Medicines for anxiety include: Antidepressant medicines. These are usually prescribed for long-term daily control. Anti-anxiety medicines. These may be added in severe cases, especially when panic attacks occur. When used together, medicines, psychotherapy, and tension-reduction techniques may be the most effective treatment. Relationships Relationships can play a big part in helping you recover. Spend more time connecting with trusted friends and family members. Think about going to couples counseling if you have a partner, taking family education classes, or going to family therapy. Therapy can help you and others better understand your anxiety. How to recognize changes in your anxiety Everyone responds differently to treatment for anxiety. Recovery from anxiety happens when symptoms lessen and stop interfering with your daily life at home or work. This may mean that you will start to: Have better concentration and focus. Worry will interfere less in your daily thinking. Sleep better. Be less irritable. Have more energy. Have improved memory. Try to recognize when your condition is getting worse. Contact your provider if your symptoms interfere with home or work and you feel like your condition is not improving. Follow these instructions at home: Activity Exercise. Adults should: Exercise for at least 150 minutes each week. The exercise should increase your heart rate and make you sweat (moderate-intensity exercise). Do strengthening exercises at least twice a week. Get the right amount and quality of sleep. Most adults need 7-9 hours of sleep each night. Lifestyle  Eat a healthy diet that includes plenty of vegetables, fruits, whole grains, low-fat dairy products, and lean protein. Do not eat a lot of foods that are high in fats, added sugars, or salt  (sodium). Make choices that simplify your life. Do not use any products that contain nicotine or tobacco. These products include cigarettes, chewing tobacco, and vaping devices, such as e-cigarettes. If you need help quitting, ask your provider. Avoid caffeine, alcohol, and certain over-the-counter cold medicines. These may make you feel worse. Ask your pharmacist which medicines to avoid. General instructions Take over-the-counter and prescription medicines only as told by your provider. Keep all follow-up visits. This is to make sure you are managing your anxiety well or if you need more support. Where to find support You can get help and support from: Self-help groups. Online and [employer]. A trusted spiritual leader. Couples counseling. Family education classes. Family therapy. Where to find more information You may find that joining a support group helps you deal with your anxiety. The following sources can help you find counselors or support groups near you: Mental Health America: mentalhealthamerica.net Anxiety and Depression Association of [place] (ADAA): adaa.org The [employer] Mental Illness (NAMI): nami.org Contact a health care provider if: You have a hard time staying focused or finishing tasks. You spend many hours a day feeling worried about everyday life. You are very tired because you cannot stop worrying. You start to have headaches or often feel tense. You have chronic nausea or diarrhea. Get help right away if: Your heart feels like it is racing. You have shortness of breath. You have thoughts of hurting yourself or others. Get help  right away if you feel like you may hurt yourself or others, or have thoughts about taking your own life. Go to your nearest emergency room or: Call 911. Call the National Suicide Prevention Lifeline at 619-416-3275 or 988. This is open 24 hours a day. Text the Crisis Text Line at 872-376-7452. This information is not  intended to replace advice given to you by your health care provider. Make sure you discuss any questions you have with your health care provider. Document Revised: 08/31/2022 Document Reviewed: 03/15/2021 Elsevier Patient Education  2024 [employer].

## 2023-09-20 ENCOUNTER — Ambulatory Visit: Payer: 59 | Admitting: Nurse Practitioner

## 2023-09-20 ENCOUNTER — Encounter: Payer: Self-pay | Admitting: Nurse Practitioner

## 2023-09-20 VITALS — BP 125/77 | HR 61 | Temp 98.1°F | Ht 67.2 in | Wt 148.2 lb

## 2023-09-20 DIAGNOSIS — F5104 Psychophysiologic insomnia: Secondary | ICD-10-CM

## 2023-09-20 DIAGNOSIS — F411 Generalized anxiety disorder: Secondary | ICD-10-CM

## 2023-09-20 DIAGNOSIS — Z79899 Other long term (current) drug therapy: Secondary | ICD-10-CM

## 2023-09-20 MED ORDER — ALPRAZOLAM 0.25 MG PO TABS: 0.2500 mg | ORAL_TABLET | Freq: Two times a day (BID) | ORAL | 2 refills | Status: DC

## 2023-09-20 NOTE — Assessment & Plan Note (Signed)
Chronic, stable, since loss of grandson >3 years ago. Denies SI/HI. Has not tolerated SSRI medications.  Continues on Xanax BID, but is reducing and currently taking once a day in morning with plan to continue reduction. Discussed the risks of long term benzo use.  UDS due next 09/19/24.  Return in 3 months.

## 2023-09-20 NOTE — Progress Notes (Signed)
BP 125/77   Pulse 61   Temp 98.1 F (36.7 C) (Oral)   Ht 5' 7.2" (1.707 m)   Wt 148 lb 3.2 oz (67.2 kg)   SpO2 98%   BMI 23.07 kg/m    Subjective:    Patient ID: Lori May, female    DOB: 1961-09-08, 62 y.o.   MRN: [id number]  HPI: Lori May is a 62 y.o. female  Chief Complaint  Patient presents with   Anxiety   ANXIETY/STRESS Presents for mood follow-up. Lost grandson >3 years ago at young age to brain tumor, was placed on Xanax.  Tried different medications -- Zoloft and some others in past -- did not tolerate.  Pt made aware of risks of benzo medication use to include increased sedation, respiratory suppression, falls, dependence and cardiovascular events.  Pt would like to continue treatment as benefit determined to outweigh risk.  She currently is pretty much off the Xanax at night -- is slowly reducing, plans to be off completely in April.  She occasionally attends therapy.  Taken Ambien and Trazodone for insomnia, taken for > 10 years.  Discussed at length risks of long term Ambien use and that when age 35 will need to lower dose to 5 MG.  Last Ambien fill on PDMP review was 08/22/23 (#90 tablets) and Xanax 08/01/23 (#60 tablets).   Duration: controlled Anxious mood: occasional Excessive worrying: occasional Irritability: no  Sweating: no Nausea: no Palpitations:no Hyperventilation: no Panic attacks: no Agoraphobia: no  Obscessions/compulsions: no Depressed mood: no    2023/10/06    9:43 AM 06/21/2023   10:43 AM 03/02/2023   10:33 AM 11/24/2022   10:14 AM 09/13/2022    3:26 PM  Depression screen PHQ 2/9  Decreased Interest 0 0 0 0 0  Down, Depressed, Hopeless 0 0 0 0 0  PHQ - 2 Score 0 0 0 0 0  Altered sleeping 0 0 0 0 0  Tired, decreased energy 0 0 0 0 0  Change in appetite 0 0 0 0 0  Feeling bad or failure about yourself  0 0 0 0 0  Trouble concentrating 0 0 0 0 0  Moving slowly or fidgety/restless 0 0 0 0 0  Suicidal thoughts 0 0 0 0 0  PHQ-9 Score  0 0 0 0 0  Difficult doing work/chores Not difficult at all Not difficult at all Not difficult at all Not difficult at all Not difficult at all  Anhedonia: no Weight changes: no Insomnia: no takes Ambien Hypersomnia: no Fatigue/loss of energy: no Feelings of worthlessness: no Feelings of guilt: no Impaired concentration/indecisiveness: no Suicidal ideations: no  Crying spells: no Recent Stressors/Life Changes: no   Relationship problems: no   Family stress: no     Financial stress: no    Job stress: no    Recent death/loss: no     2023-10-06    9:43 AM 06/21/2023   10:44 AM 03/02/2023   10:33 AM 11/24/2022   10:14 AM  GAD 7 : Generalized Anxiety Score  Nervous, Anxious, on Edge 0 0 0 0  Control/stop worrying 0 0 0 0  Worry too much - different things 0 0 0 0  Trouble relaxing 0 0 0 0  Restless 0 0 0 0  Easily annoyed or irritable 0 0 0 0  Afraid - awful might happen 0 0 0 0  Total GAD 7 Score 0 0 0 0  Anxiety Difficulty Not difficult at all Not difficult  at all Not difficult at all Not difficult at all     Relevant past medical, surgical, family and social history reviewed and updated as indicated. Interim medical history since our last visit reviewed. Allergies and medications reviewed and updated.  Review of Systems  Constitutional:  Negative for activity change, appetite change, diaphoresis, fatigue and fever.  Respiratory:  Negative for cough, chest tightness and shortness of breath.   Cardiovascular:  Negative for chest pain, palpitations and leg swelling.  Gastrointestinal: Negative.   Neurological: Negative.   Psychiatric/Behavioral:  Positive for sleep disturbance. Negative for decreased concentration, self-injury and suicidal ideas. The patient is not nervous/anxious.    Per HPI unless specifically indicated above     Objective:    BP 125/77   Pulse 61   Temp 98.1 F (36.7 C) (Oral)   Ht 5' 7.2" (1.707 m)   Wt 148 lb 3.2 oz (67.2 kg)   SpO2 98%   BMI  23.07 kg/m   Wt Readings from Last 3 Encounters:  09/20/23 148 lb 3.2 oz (67.2 kg)  06/21/23 138 lb 9.6 oz (62.9 kg)  03/02/23 136 lb 12.8 oz (62.1 kg)    Physical Exam Vitals and nursing note reviewed.  Constitutional:      General: She is awake. She is not in acute distress.    Appearance: She is well-developed. She is not ill-appearing.  HENT:     Head: Normocephalic.     Right Ear: Hearing normal.     Left Ear: Hearing normal.  Eyes:     General: Lids are normal.        Right eye: No discharge.        Left eye: No discharge.     Conjunctiva/sclera: Conjunctivae normal.  Pulmonary:     Effort: Pulmonary effort is normal. No accessory muscle usage or respiratory distress.  Musculoskeletal:     Cervical back: Normal range of motion.  Neurological:     Mental Status: She is alert and oriented to person, place, and time.  Psychiatric:        Attention and Perception: Attention normal.        Mood and Affect: Mood normal.        Behavior: Behavior normal. Behavior is cooperative.        Thought Content: Thought content normal.        Judgment: Judgment normal.    Results for orders placed or performed in visit on 06/21/23  Cologuard  Result Value Ref Range   COLOGUARD Negative Negative  CBC with Differential/Platelet  Result Value Ref Range   WBC 5.9 3.4 - 10.8 x10E3/uL   RBC 4.30 3.77 - 5.28 x10E6/uL   Hemoglobin 14.3 11.1 - 15.9 g/dL   Hematocrit 16.1 09.6 - 46.6 %   MCV 97 79 - 97 fL   MCH 33.3 (H) 26.6 - 33.0 pg   MCHC 34.4 31.5 - 35.7 g/dL   RDW 04.5 40.9 - 81.1 %   Platelets 207 150 - 450 x10E3/uL   Neutrophils 71 Not Estab. %   Lymphs 20 Not Estab. %   Monocytes 8 Not Estab. %   Eos 1 Not Estab. %   Basos 0 Not Estab. %   Neutrophils Absolute 4.2 1.4 - 7.0 x10E3/uL   Lymphocytes Absolute 1.2 0.7 - 3.1 x10E3/uL   Monocytes Absolute 0.5 0.1 - 0.9 x10E3/uL   EOS (ABSOLUTE) 0.1 0.0 - 0.4 x10E3/uL   Basophils Absolute 0.0 0.0 - 0.2 x10E3/uL   Immature  Granulocytes 0 Not Estab. %   Immature Grans (Abs) 0.0 0.0 - 0.1 x10E3/uL  Comprehensive metabolic panel  Result Value Ref Range   Glucose 92 70 - 99 mg/dL   BUN 10 8 - 27 mg/dL   Creatinine, Ser 4.09 0.57 - 1.00 mg/dL   eGFR 97 >81 XB/JYN/8.29   BUN/Creatinine Ratio 14 12 - 28   Sodium 138 134 - 144 mmol/L   Potassium 4.4 3.5 - 5.2 mmol/L   Chloride 101 96 - 106 mmol/L   CO2 23 20 - 29 mmol/L   Calcium 9.7 8.7 - 10.3 mg/dL   Total Protein 6.6 6.0 - 8.5 g/dL   Albumin 4.5 3.9 - 4.9 g/dL   Globulin, Total 2.1 1.5 - 4.5 g/dL   Bilirubin Total 0.4 0.0 - 1.2 mg/dL   Alkaline Phosphatase 67 44 - 121 IU/L   AST 19 0 - 40 IU/L   ALT 12 0 - 32 IU/L  Lipid Panel w/o Chol/HDL Ratio  Result Value Ref Range   Cholesterol, Total 163 100 - 199 mg/dL   Triglycerides 562 0 - 149 mg/dL   HDL 65 >13 mg/dL   VLDL Cholesterol Cal 21 5 - 40 mg/dL   LDL Chol Calc (NIH) 77 0 - 99 mg/dL  TSH  Result Value Ref Range   TSH 2.880 0.450 - 4.500 uIU/mL      Assessment & Plan:   Problem List Items Addressed This Visit       Other   Generalized anxiety disorder - Primary    Chronic, stable, since loss of grandson >3 years ago. Denies SI/HI. Has not tolerated SSRI medications.  Continues on Xanax BID, but is reducing and currently taking once a day in morning with plan to continue reduction. Discussed the risks of long term benzo use.  UDS due next 09/19/24.  Return in 3 months.      Relevant Medications   ALPRAZolam (XANAX) 0.25 MG tablet   Other Relevant Orders   [id number] 11+Oxyco+Alc+Crt-Bund   Insomnia    Chronic, ongoing for several years.  Continue current medication regimen to include Ambien and Trazodone, discussed risks of long term Ambien use and that with age will need to reduce dose. Continue good sleep hygiene.  Has stopped Xanax at night.      Long term prescription benzodiazepine use    Refer to anxiety plan of care.  UDS due next 09/19/24.      Relevant Orders   [id number]  11+Oxyco+Alc+Crt-Bund     Follow up plan: Return in about 3 months (around 12/21/2023) for MOOD AND SLEEP.

## 2023-09-20 NOTE — Assessment & Plan Note (Signed)
Chronic, ongoing for several years.  Continue current medication regimen to include Ambien and Trazodone, discussed risks of long term Ambien use and that with age will need to reduce dose. Continue good sleep hygiene.  Has stopped Xanax at night.

## 2023-09-20 NOTE — Assessment & Plan Note (Signed)
Refer to anxiety plan of care.  UDS due next 09/19/24.

## 2023-09-23 LAB — DRUG SCREEN 764883 11+OXYCO+ALC+CRT-BUND
Amphetamines, Urine: NEGATIVE ng/mL
Barbiturate: NEGATIVE ng/mL
Cannabinoid Quant, Ur: NEGATIVE ng/mL
Cocaine (Metabolite): NEGATIVE ng/mL
Creatinine: 102.1 mg/dL (ref 20.0–300.0)
Ethanol: NEGATIVE %
Meperidine: NEGATIVE ng/mL
Methadone Screen, Urine: NEGATIVE ng/mL
OPIATE SCREEN URINE: NEGATIVE ng/mL
Oxycodone/Oxymorphone, Urine: NEGATIVE ng/mL
Phencyclidine: NEGATIVE ng/mL
Propoxyphene: NEGATIVE ng/mL
Tramadol: NEGATIVE ng/mL
pH, Urine: 5.7 (ref 4.5–8.9)

## 2023-09-23 LAB — BENZODIAZEPINES CONFIRM, URINE: Benzodiazepines: NEGATIVE ng/mL

## 2023-12-17 NOTE — Patient Instructions (Signed)
 Be Involved in Caring For Your Health:  Taking Medications When medications are taken as directed, they can greatly improve your health. But if they are not taken as prescribed, they may not work. In some cases, not taking them correctly can be harmful. To help ensure your treatment remains effective and safe, understand your medications and how to take them. Bring your medications to each visit for review by your provider.  Your lab results, notes, and after visit summary will be available on My Chart. We strongly encourage you to use this feature. If lab results are abnormal the clinic will contact you with the appropriate steps. If the clinic does not contact you assume the results are satisfactory. You can always view your results on My Chart. If you have questions regarding your health or results, please contact the clinic during office hours. You can also ask questions on My Chart.  We at [facility] are grateful that you chose [place] to provide your care. We strive to provide evidence-based and compassionate care and are always looking for feedback. If you get a survey from the clinic please complete this so we can hear your opinions.  Managing Anxiety, Adult After being diagnosed with anxiety, you may be relieved to know why you have felt or behaved a certain way. You may also feel overwhelmed about the treatment ahead and what it will mean for your life. With care and support, you can manage your anxiety. How to manage lifestyle changes Understanding the difference between stress and anxiety Although stress can play a role in anxiety, it is not the same as anxiety. Stress is your body's reaction to life changes and events, both good and bad. Stress is often caused by something external, such as a deadline, test, or competition. It normally goes away after the event has ended and will last just a few hours. But, stress can be ongoing and can lead to more than just stress. Anxiety is  caused by something internal, such as imagining a terrible outcome or worrying that something will go wrong that will greatly upset you. Anxiety often does not go away even after the event is over, and it can become a long-term (chronic) worry. Lowering stress and anxiety Talk with your health care provider or a counselor to learn more about lowering anxiety and stress. They may suggest tension-reduction techniques, such as: Music. Spend time creating or listening to music that you enjoy and that inspires you. Mindfulness-based meditation. Practice being aware of your normal breaths while not trying to control your breathing. It can be done while sitting or walking. Centering prayer. Focus on a word, phrase, or sacred image that means something to you and brings you peace. Deep breathing. Expand your stomach and inhale slowly through your nose. Hold your breath for 3-5 seconds. Then breathe out slowly, letting your stomach muscles relax. Self-talk. Learn to notice and spot thought patterns that lead to anxiety reactions. Change those patterns to thoughts that feel peaceful. Muscle relaxation. Take time to tense muscles and then relax them. Choose a tension-reduction technique that fits your lifestyle and personality. These techniques take time and practice. Set aside 5-15 minutes a day to do them. Specialized therapists can offer counseling and training in these techniques. The training to help with anxiety may be covered by some insurance plans. Other things you can do to manage stress and anxiety include: Keeping a stress diary. This can help you learn what triggers your reaction and then learn ways  to manage your response. Thinking about how you react to certain situations. You may not be able to control everything, but you can control your response. Making time for activities that help you relax and not feeling guilty about spending your time in this way. Doing visual imagery. This involves  imagining or creating mental pictures to help you relax. Practicing yoga. Through yoga poses, you can lower tension and relax.  Medicines Medicines for anxiety include: Antidepressant medicines. These are usually prescribed for long-term daily control. Anti-anxiety medicines. These may be added in severe cases, especially when panic attacks occur. When used together, medicines, psychotherapy, and tension-reduction techniques may be the most effective treatment. Relationships Relationships can play a big part in helping you recover. Spend more time connecting with trusted friends and family members. Think about going to couples counseling if you have a partner, taking family education classes, or going to family therapy. Therapy can help you and others better understand your anxiety. How to recognize changes in your anxiety Everyone responds differently to treatment for anxiety. Recovery from anxiety happens when symptoms lessen and stop interfering with your daily life at home or work. This may mean that you will start to: Have better concentration and focus. Worry will interfere less in your daily thinking. Sleep better. Be less irritable. Have more energy. Have improved memory. Try to recognize when your condition is getting worse. Contact your provider if your symptoms interfere with home or work and you feel like your condition is not improving. Follow these instructions at home: Activity Exercise. Adults should: Exercise for at least 150 minutes each week. The exercise should increase your heart rate and make you sweat (moderate-intensity exercise). Do strengthening exercises at least twice a week. Get the right amount and quality of sleep. Most adults need 7-9 hours of sleep each night. Lifestyle  Eat a healthy diet that includes plenty of vegetables, fruits, whole grains, low-fat dairy products, and lean protein. Do not eat a lot of foods that are high in fats, added sugars, or salt  (sodium). Make choices that simplify your life. Do not use any products that contain nicotine or tobacco. These products include cigarettes, chewing tobacco, and vaping devices, such as e-cigarettes. If you need help quitting, ask your provider. Avoid caffeine, alcohol, and certain over-the-counter cold medicines. These may make you feel worse. Ask your pharmacist which medicines to avoid. General instructions Take over-the-counter and prescription medicines only as told by your provider. Keep all follow-up visits. This is to make sure you are managing your anxiety well or if you need more support. Where to find support You can get help and support from: Self-help groups. Online and [employer]. A trusted spiritual leader. Couples counseling. Family education classes. Family therapy. Where to find more information You may find that joining a support group helps you deal with your anxiety. The following sources can help you find counselors or support groups near you: Mental Health America: mentalhealthamerica.net Anxiety and Depression Association of [place] (ADAA): adaa.org The [employer] on Mental Illness (NAMI): nami.org Contact a health care provider if: You have a hard time staying focused or finishing tasks. You spend many hours a day feeling worried about everyday life. You are very tired because you cannot stop worrying. You start to have headaches or often feel tense. You have chronic nausea or diarrhea. Get help right away if: Your heart feels like it is racing. You have shortness of breath. You have thoughts of hurting yourself or others. Get help  right away if you feel like you may hurt yourself or others, or have thoughts about taking your own life. Go to your nearest emergency room or: Call 911. Call the National Suicide Prevention Lifeline at 765-482-1593 or 988. This is open 24 hours a day. Text the Crisis Text Line at 504 124 9896. This information is not  intended to replace advice given to you by your health care provider. Make sure you discuss any questions you have with your health care provider. Document Revised: 08/31/2022 Document Reviewed: 03/15/2021 Elsevier Patient Education  2024 [employer].

## 2023-12-21 ENCOUNTER — Encounter: Payer: Self-pay | Admitting: Nurse Practitioner

## 2023-12-21 ENCOUNTER — Ambulatory Visit: Payer: PRIVATE HEALTH INSURANCE | Admitting: Nurse Practitioner

## 2023-12-21 VITALS — BP 124/82 | HR 69 | Temp 97.6°F | Wt 144.4 lb

## 2023-12-21 DIAGNOSIS — Z79899 Other long term (current) drug therapy: Secondary | ICD-10-CM

## 2023-12-21 DIAGNOSIS — F5104 Psychophysiologic insomnia: Secondary | ICD-10-CM | POA: Diagnosis not present

## 2023-12-21 DIAGNOSIS — F411 Generalized anxiety disorder: Secondary | ICD-10-CM

## 2023-12-21 MED ORDER — ALPRAZOLAM 0.25 MG PO TABS: 0.2500 mg | ORAL_TABLET | Freq: Two times a day (BID) | ORAL | 2 refills | Status: DC

## 2023-12-21 NOTE — Assessment & Plan Note (Signed)
Refer to anxiety plan of care.  UDS due next 09/19/24.

## 2023-12-21 NOTE — Progress Notes (Signed)
 BP 124/82 (BP Location: Left Arm, Cuff Size: Normal)   Pulse 69   Temp 97.6 F (36.4 C) (Oral)   Wt 144 lb 6.4 oz (65.5 kg)   SpO2 97%   BMI 22.48 kg/m    Subjective:    Patient ID: Lori May, female    DOB: 06/26/1961, 63 y.o.   MRN: [id number]  HPI: Lori May is a 63 y.o. female  Chief Complaint  Patient presents with   Anxiety   Insomnia   ANXIETY/STRESS Follow-up for mood. Lost grandson >4 years ago at young age to brain tumor, was placed on Xanax .  Tried different medications -- Zoloft  and some others in past -- did not tolerate.  Pt made aware of risks of benzo medication use to include increased sedation, respiratory suppression, falls, dependence and cardiovascular events.  Pt would like to continue treatment as benefit determined to outweigh risk.  Went to therapy in past with no benefit.  Takes Xanax  twice a day.  Taken Ambien  and Trazodone  for insomnia, taken for > 10 years.  Discussed at length risks of long term Ambien  use and that when age 71 will need to lower dose to 5 MG.  Last Ambien  fill on PDMP review was 11/24/23 (#90 tablets) and Xanax  11/24/23 (#60 tablets).   Duration: controlled Anxious mood: occasional Excessive worrying: occasional Irritability: sometimes Sweating: no Nausea: no Palpitations:no Hyperventilation: no Panic attacks: no Agoraphobia: no  Obscessions/compulsions: no Depressed mood: occasional    03-Jan-2024   10:19 AM 09/20/2023    9:43 AM 06/21/2023   10:43 AM 03/02/2023   10:33 AM 11/24/2022   10:14 AM  Depression screen PHQ 2/9  Decreased Interest 0 0 0 0 0  Down, Depressed, Hopeless 1 0 0 0 0  PHQ - 2 Score 1 0 0 0 0  Altered sleeping 0 0 0 0 0  Tired, decreased energy 0 0 0 0 0  Change in appetite 0 0 0 0 0  Feeling bad or failure about yourself  0 0 0 0 0  Trouble concentrating 0 0 0 0 0  Moving slowly or fidgety/restless 0 0 0 0 0  Suicidal thoughts 0 0 0 0 0  PHQ-9 Score 1 0 0 0 0  Difficult doing work/chores  Not difficult at all Not difficult at all Not difficult at all Not difficult at all Not difficult at all  Anhedonia: no Weight changes: no Insomnia: no takes Ambien  -- although still will struggle falling asleep Hypersomnia: no Fatigue/loss of energy: no Feelings of worthlessness: no Feelings of guilt: no Impaired concentration/indecisiveness: no Suicidal ideations: no  Crying spells: yes Recent Stressors/Life Changes: no   Relationship problems: no   Family stress: no     Financial stress: no    Job stress: no    Recent death/loss: no     03-Jan-2024   10:21 AM 09/20/2023    9:43 AM 06/21/2023   10:44 AM 03/02/2023   10:33 AM  GAD 7 : Generalized Anxiety Score  Nervous, Anxious, on Edge 1 0 0 0  Control/stop worrying 1 0 0 0  Worry too much - different things 1 0 0 0  Trouble relaxing 0 0 0 0  Restless 0 0 0 0  Easily annoyed or irritable 0 0 0 0  Afraid - awful might happen 0 0 0 0  Total GAD 7 Score 3 0 0 0  Anxiety Difficulty Not difficult at all Not difficult at all Not difficult at  all Not difficult at all     Relevant past medical, surgical, family and social history reviewed and updated as indicated. Interim medical history since our last visit reviewed. Allergies and medications reviewed and updated.  Review of Systems  Constitutional:  Negative for activity change, appetite change, diaphoresis, fatigue and fever.  Respiratory:  Negative for cough, chest tightness and shortness of breath.   Cardiovascular:  Negative for chest pain, palpitations and leg swelling.  Gastrointestinal: Negative.   Neurological: Negative.   Psychiatric/Behavioral:  Positive for sleep disturbance. Negative for decreased concentration, self-injury and suicidal ideas. The patient is nervous/anxious.    Per HPI unless specifically indicated above     Objective:    BP 124/82 (BP Location: Left Arm, Cuff Size: Normal)   Pulse 69   Temp 97.6 F (36.4 C) (Oral)   Wt 144 lb 6.4 oz (65.5  kg)   SpO2 97%   BMI 22.48 kg/m   Wt Readings from Last 3 Encounters:  12/21/23 144 lb 6.4 oz (65.5 kg)  09/20/23 148 lb 3.2 oz (67.2 kg)  06/21/23 138 lb 9.6 oz (62.9 kg)    Physical Exam Vitals and nursing note reviewed.  Constitutional:      General: She is awake. She is not in acute distress.    Appearance: She is well-developed. She is not ill-appearing.  HENT:     Head: Normocephalic.     Right Ear: Hearing normal.     Left Ear: Hearing normal.  Eyes:     General: Lids are normal.        Right eye: No discharge.        Left eye: No discharge.     Conjunctiva/sclera: Conjunctivae normal.  Pulmonary:     Effort: Pulmonary effort is normal. No accessory muscle usage or respiratory distress.  Musculoskeletal:     Cervical back: Normal range of motion.  Neurological:     Mental Status: She is alert and oriented to person, place, and time.  Psychiatric:        Attention and Perception: Attention normal.        Mood and Affect: Mood normal.        Behavior: Behavior normal. Behavior is cooperative.        Thought Content: Thought content normal.        Judgment: Judgment normal.    Results for orders placed or performed in visit on 09/20/23  [id number] 11+Oxyco+Alc+Crt-Bund   Collection Time: 09/20/23 10:02 AM  Result Value Ref Range   Ethanol Negative Cutoff=0.020 %   Amphetamines, Urine Negative Cutoff=1000 ng/mL   Barbiturate Negative Cutoff=200 ng/mL   BENZODIAZ UR QL See Final Results Cutoff=200 ng/mL   Cannabinoid Quant, Ur Negative Cutoff=50 ng/mL   Cocaine (Metabolite) Negative Cutoff=300 ng/mL   OPIATE SCREEN URINE Negative Cutoff=300 ng/mL   Oxycodone /Oxymorphone, Urine Negative Cutoff=300 ng/mL   Phencyclidine Negative Cutoff=25 ng/mL   Methadone Screen, Urine Negative Cutoff=300 ng/mL   Propoxyphene Negative Cutoff=300 ng/mL   Meperidine Negative Cutoff=200 ng/mL   Tramadol Negative Cutoff=200 ng/mL   Creatinine 102.1 20.0 - 300.0 mg/dL   pH, Urine 5.7  4.5 - 8.9  Benzodiazepines Confirm, Urine   Collection Time: 09/20/23 10:02 AM  Result Value Ref Range   Benzodiazepines Negative Cutoff=200 ng/mL      Assessment & Plan:   Problem List Items Addressed This Visit       Other   Generalized anxiety disorder - Primary   Chronic, stable, since loss of grandson >3  years ago. Denies SI/HI. Has not tolerated SSRI medications.  Continues on Xanax  BID and attempt reductions when able, she tried in the past but could not reduce. Discussed the risks of long term benzo use.  UDS due next 09/19/24.  Refills sent = 30 tablet with 2 refills.  Return in 3 months.      Relevant Medications   ALPRAZolam  (XANAX ) 0.25 MG tablet   Insomnia   Chronic, ongoing for several years.  Continue current medication regimen to include Ambien  and Trazodone , discussed risks of long term Ambien  use and that with age will need to reduce dose. Continue good sleep hygiene.  H      Long term prescription benzodiazepine use   Refer to anxiety plan of care.  UDS due next 09/19/24.         Follow up plan: Return in about 3 months (around 03/20/2024) for ANXIETY & INSOMNIA.

## 2023-12-21 NOTE — Assessment & Plan Note (Signed)
 Chronic, ongoing for several years.  Continue current medication regimen to include Ambien  and Trazodone , discussed risks of long term Ambien  use and that with age will need to reduce dose. Continue good sleep hygiene.  H

## 2023-12-21 NOTE — Assessment & Plan Note (Addendum)
 Chronic, stable, since loss of grandson >3 years ago. Denies SI/HI. Has not tolerated SSRI medications.  Continues on Xanax  BID and attempt reductions when able, she tried in the past but could not reduce. Discussed the risks of long term benzo use.  UDS due next 09/19/24.  Refills sent = 30 tablet with 2 refills.  Return in 3 months.

## 2024-02-27 ENCOUNTER — Other Ambulatory Visit: Payer: Self-pay | Admitting: Nurse Practitioner

## 2024-02-28 NOTE — Telephone Encounter (Signed)
 Requested medication (s) are due for refill today - yes  Requested medication (s) are on the active medication list -yes  Future visit scheduled -yes  Last refill: 08/22/23 #90  Notes to clinic: non delegated Rx  Requested Prescriptions  Pending Prescriptions Disp Refills   zolpidem (AMBIEN) 10 MG tablet [Pharmacy Med Name: ZOLPIDEM TARTRATE 10 MG TABLET] 90 tablet     Sig: TAKE 1 TABLET BY MOUTH AT BEDTIME     Not Delegated - Psychiatry:  Anxiolytics/Hypnotics Failed - 02/28/2024  3:34 PM      Failed - This refill cannot be delegated      Passed - Urine Drug Screen completed in last 360 days      Passed - Valid encounter within last 6 months    Recent Outpatient Visits           2 months ago Generalized anxiety disorder   [place] [facility] [place], Corrie Dandy T, NP   5 months ago Generalized anxiety disorder   [place] [facility] [place], Ladora T, NP   8 months ago Essential hypertension   [place] [facility] [place], [place] T, NP   12 months ago Generalized anxiety disorder   [place] [facility] [place], Corrie Dandy T, NP   1 year ago Generalized anxiety disorder   [place] [facility] [place], Dorie Rank, NP       Future Appointments             In 4 weeks Cannady, Dorie Rank, NP [place] [facility]               Requested Prescriptions  Pending Prescriptions Disp Refills   zolpidem (AMBIEN) 10 MG tablet [Pharmacy Med Name: ZOLPIDEM TARTRATE 10 MG TABLET] 90 tablet     Sig: TAKE 1 TABLET BY MOUTH AT BEDTIME     Not Delegated - Psychiatry:  Anxiolytics/Hypnotics Failed - 02/28/2024  3:34 PM      Failed - This refill cannot be delegated      Passed - Urine Drug Screen completed in last 360 days      Passed - Valid encounter within last 6 months    Recent Outpatient Visits           2 months ago Generalized anxiety disorder   [place] [facility] [place], [place] T, NP   5 months ago Generalized anxiety disorder   [place] [facility] [place], [place] T, NP   8 months ago Essential hypertension   [place] [facility] [place], [place] T, NP   12 months ago Generalized anxiety disorder   [place] [facility] [place], Corrie Dandy T, NP   1 year ago Generalized anxiety disorder   [place] [facility] [place], Dorie Rank, NP       Future Appointments             In 4 weeks Cannady, Dorie Rank, NP [place] [facility], [facility]

## 2024-03-25 NOTE — Patient Instructions (Signed)
 Be Involved in Caring For Your Health:  Taking Medications When medications are taken as directed, they can greatly improve your health. But if they are not taken as prescribed, they may not work. In some cases, not taking them correctly can be harmful. To help ensure your treatment remains effective and safe, understand your medications and how to take them. Bring your medications to each visit for review by your provider.  Your lab results, notes, and after visit summary will be available on My Chart. We strongly encourage you to use this feature. If lab results are abnormal the clinic will contact you with the appropriate steps. If the clinic does not contact you assume the results are satisfactory. You can always view your results on My Chart. If you have questions regarding your health or results, please contact the clinic during office hours. You can also ask questions on My Chart.  We at [facility] are grateful that you chose [place] to provide your care. We strive to provide evidence-based and compassionate care and are always looking for feedback. If you get a survey from the clinic please complete this so we can hear your opinions.  Managing Anxiety, Adult After being diagnosed with anxiety, you may be relieved to know why you have felt or behaved a certain way. You may also feel overwhelmed about the treatment ahead and what it will mean for your life. With care and support, you can manage your anxiety. How to manage lifestyle changes Understanding the difference between stress and anxiety Although stress can play a role in anxiety, it is not the same as anxiety. Stress is your body's reaction to life changes and events, both good and bad. Stress is often caused by something external, such as a deadline, test, or competition. It normally goes away after the event has ended and will last just a few hours. But, stress can be ongoing and can lead to more than just stress. Anxiety is  caused by something internal, such as imagining a terrible outcome or worrying that something will go wrong that will greatly upset you. Anxiety often does not go away even after the event is over, and it can become a long-term (chronic) worry. Lowering stress and anxiety Talk with your health care provider or a counselor to learn more about lowering anxiety and stress. They may suggest tension-reduction techniques, such as: Music. Spend time creating or listening to music that you enjoy and that inspires you. Mindfulness-based meditation. Practice being aware of your normal breaths while not trying to control your breathing. It can be done while sitting or walking. Centering prayer. Focus on a word, phrase, or sacred image that means something to you and brings you peace. Deep breathing. Expand your stomach and inhale slowly through your nose. Hold your breath for 3-5 seconds. Then breathe out slowly, letting your stomach muscles relax. Self-talk. Learn to notice and spot thought patterns that lead to anxiety reactions. Change those patterns to thoughts that feel peaceful. Muscle relaxation. Take time to tense muscles and then relax them. Choose a tension-reduction technique that fits your lifestyle and personality. These techniques take time and practice. Set aside 5-15 minutes a day to do them. Specialized therapists can offer counseling and training in these techniques. The training to help with anxiety may be covered by some insurance plans. Other things you can do to manage stress and anxiety include: Keeping a stress diary. This can help you learn what triggers your reaction and then learn ways  to manage your response. Thinking about how you react to certain situations. You may not be able to control everything, but you can control your response. Making time for activities that help you relax and not feeling guilty about spending your time in this way. Doing visual imagery. This involves  imagining or creating mental pictures to help you relax. Practicing yoga. Through yoga poses, you can lower tension and relax.  Medicines Medicines for anxiety include: Antidepressant medicines. These are usually prescribed for long-term daily control. Anti-anxiety medicines. These may be added in severe cases, especially when panic attacks occur. When used together, medicines, psychotherapy, and tension-reduction techniques may be the most effective treatment. Relationships Relationships can play a big part in helping you recover. Spend more time connecting with trusted friends and family members. Think about going to couples counseling if you have a partner, taking family education classes, or going to family therapy. Therapy can help you and others better understand your anxiety. How to recognize changes in your anxiety Everyone responds differently to treatment for anxiety. Recovery from anxiety happens when symptoms lessen and stop interfering with your daily life at home or work. This may mean that you will start to: Have better concentration and focus. Worry will interfere less in your daily thinking. Sleep better. Be less irritable. Have more energy. Have improved memory. Try to recognize when your condition is getting worse. Contact your provider if your symptoms interfere with home or work and you feel like your condition is not improving. Follow these instructions at home: Activity Exercise. Adults should: Exercise for at least 150 minutes each week. The exercise should increase your heart rate and make you sweat (moderate-intensity exercise). Do strengthening exercises at least twice a week. Get the right amount and quality of sleep. Most adults need 7-9 hours of sleep each night. Lifestyle  Eat a healthy diet that includes plenty of vegetables, fruits, whole grains, low-fat dairy products, and lean protein. Do not eat a lot of foods that are high in fats, added sugars, or salt  (sodium). Make choices that simplify your life. Do not use any products that contain nicotine or tobacco. These products include cigarettes, chewing tobacco, and vaping devices, such as e-cigarettes. If you need help quitting, ask your provider. Avoid caffeine, alcohol, and certain over-the-counter cold medicines. These may make you feel worse. Ask your pharmacist which medicines to avoid. General instructions Take over-the-counter and prescription medicines only as told by your provider. Keep all follow-up visits. This is to make sure you are managing your anxiety well or if you need more support. Where to find support You can get help and support from: Self-help groups. Online and [employer]. A trusted spiritual leader. Couples counseling. Family education classes. Family therapy. Where to find more information You may find that joining a support group helps you deal with your anxiety. The following sources can help you find counselors or support groups near you: Mental Health America: mentalhealthamerica.net Anxiety and Depression Association of [place] (ADAA): adaa.org The [employer] on Mental Illness (NAMI): nami.org Contact a health care provider if: You have a hard time staying focused or finishing tasks. You spend many hours a day feeling worried about everyday life. You are very tired because you cannot stop worrying. You start to have headaches or often feel tense. You have chronic nausea or diarrhea. Get help right away if: Your heart feels like it is racing. You have shortness of breath. You have thoughts of hurting yourself or others. Get help  right away if you feel like you may hurt yourself or others, or have thoughts about taking your own life. Go to your nearest emergency room or: Call 911. Call the National Suicide Prevention Lifeline at 765-482-1593 or 988. This is open 24 hours a day. Text the Crisis Text Line at 504 124 9896. This information is not  intended to replace advice given to you by your health care provider. Make sure you discuss any questions you have with your health care provider. Document Revised: 08/31/2022 Document Reviewed: 03/15/2021 Elsevier Patient Education  2024 [employer].

## 2024-03-28 ENCOUNTER — Ambulatory Visit: Payer: PRIVATE HEALTH INSURANCE | Admitting: Nurse Practitioner

## 2024-03-28 ENCOUNTER — Encounter: Payer: Self-pay | Admitting: Nurse Practitioner

## 2024-03-28 VITALS — BP 137/84 | HR 66 | Temp 98.4°F | Ht 67.2 in | Wt 142.6 lb

## 2024-03-28 DIAGNOSIS — F5104 Psychophysiologic insomnia: Secondary | ICD-10-CM | POA: Diagnosis not present

## 2024-03-28 DIAGNOSIS — Z79899 Other long term (current) drug therapy: Secondary | ICD-10-CM | POA: Diagnosis not present

## 2024-03-28 DIAGNOSIS — F411 Generalized anxiety disorder: Secondary | ICD-10-CM

## 2024-03-28 MED ORDER — ALPRAZOLAM 0.25 MG PO TABS: 0.2500 mg | ORAL_TABLET | Freq: Two times a day (BID) | ORAL | 2 refills | Status: DC

## 2024-03-28 MED ORDER — ZOLPIDEM TARTRATE 10 MG PO TABS: 10.0000 mg | ORAL_TABLET | Freq: Every day | ORAL | 0 refills | Status: DC

## 2024-03-28 NOTE — Assessment & Plan Note (Signed)
Refer to anxiety plan of care.  UDS due next 09/19/24.

## 2024-03-28 NOTE — Assessment & Plan Note (Signed)
Chronic, ongoing for several years.  Continue current medication regimen to include Ambien and Trazodone, discussed risks of long term Ambien use and that with age will need to reduce dose. Continue good sleep hygiene.  

## 2024-03-28 NOTE — Progress Notes (Signed)
 BP 137/84   Pulse 66   Temp 98.4 F (36.9 C) (Oral)   Ht 5' 7.2" (1.707 m)   Wt 142 lb 9.6 oz (64.7 kg)   SpO2 98%   BMI 22.20 kg/m    Subjective:    Patient ID: Lori May, female    DOB: October 12, 1961, 63 y.o.   MRN: [id number]  HPI: Lori May is a 63 y.o. female  Chief Complaint  Patient presents with   Anxiety   Insomnia   ANXIETY/STRESS Follow-up for anxiety. Lost her grandson several years ago at young age to brain tumor.  She was placed on Xanax  at that time.  Tried different medications -- Zoloft  and some others in past -- did not tolerate.  Pt made aware of risks of benzo medication use to include increased sedation, respiratory suppression, falls, dependence and cardiovascular events.  Pt would like to continue treatment as benefit determined to outweigh risk.  Went to therapy in past with no benefit.  Takes Xanax  twice a day.  Takes Ambien  and Trazodone  for insomnia, taken for > 10 years.  We discuss at each visit risks of long term Ambien  use and that when age 63 will need to lower dose to 5 MG. Ambien  fill on PDMP review was 02/28/24 (#90 tablets) and Xanax  02/27/24 (#60 tablets).   Duration: controlled Anxious mood: occasional Excessive worrying: occasional Irritability: no Sweating: no Nausea: no Palpitations:no Hyperventilation: no Panic attacks: no Agoraphobia: no  Obscessions/compulsions: no Depressed mood: sometimes    04/06/24   10:17 AM 12/21/2023   10:19 AM 09/20/2023    9:43 AM 06/21/2023   10:43 AM 03/02/2023   10:33 AM  Depression screen PHQ 2/9  Decreased Interest 0 0 0 0 0  Down, Depressed, Hopeless 0 1 0 0 0  PHQ - 2 Score 0 1 0 0 0  Altered sleeping 0 0 0 0 0  Tired, decreased energy 0 0 0 0 0  Change in appetite 0 0 0 0 0  Feeling bad or failure about yourself  0 0 0 0 0  Trouble concentrating 0 0 0 0 0  Moving slowly or fidgety/restless 0 0 0 0 0  Suicidal thoughts 0 0 0 0 0  PHQ-9 Score 0 1 0 0 0  Difficult doing work/chores  Not difficult at all Not difficult at all Not difficult at all Not difficult at all Not difficult at all  Anhedonia: no Weight changes: no Insomnia: no, takes Ambien  which offers benefit Hypersomnia: no Fatigue/loss of energy: no Feelings of worthlessness: no Feelings of guilt: no Impaired concentration/indecisiveness: no Suicidal ideations: no  Crying spells: yes Recent Stressors/Life Changes: no   Relationship problems: no   Family stress: no     Financial stress: no    Job stress: no    Recent death/loss: no     2024/04/06   10:18 AM 12/21/2023   10:21 AM 09/20/2023    9:43 AM 06/21/2023   10:44 AM  GAD 7 : Generalized Anxiety Score  Nervous, Anxious, on Edge 1 1 0 0  Control/stop worrying 1 1 0 0  Worry too much - different things 1 1 0 0  Trouble relaxing 0 0 0 0  Restless 0 0 0 0  Easily annoyed or irritable 0 0 0 0  Afraid - awful might happen 1 0 0 0  Total GAD 7 Score 4 3 0 0  Anxiety Difficulty Not difficult at all Not difficult at all Not  difficult at all Not difficult at all     Relevant past medical, surgical, family and social history reviewed and updated as indicated. Interim medical history since our last visit reviewed. Allergies and medications reviewed and updated.  Review of Systems  Constitutional:  Negative for activity change, appetite change, diaphoresis, fatigue and fever.  Respiratory:  Negative for cough, chest tightness and shortness of breath.   Cardiovascular:  Negative for chest pain, palpitations and leg swelling.  Gastrointestinal: Negative.   Neurological: Negative.   Psychiatric/Behavioral:  Positive for sleep disturbance. Negative for decreased concentration, self-injury and suicidal ideas. The patient is nervous/anxious.    Per HPI unless specifically indicated above     Objective:    BP 137/84   Pulse 66   Temp 98.4 F (36.9 C) (Oral)   Ht 5' 7.2" (1.707 m)   Wt 142 lb 9.6 oz (64.7 kg)   SpO2 98%   BMI 22.20 kg/m   Wt  Readings from Last 3 Encounters:  03/28/24 142 lb 9.6 oz (64.7 kg)  12/21/23 144 lb 6.4 oz (65.5 kg)  09/20/23 148 lb 3.2 oz (67.2 kg)    Physical Exam Vitals and nursing note reviewed.  Constitutional:      General: She is awake. She is not in acute distress.    Appearance: She is well-developed. She is not ill-appearing.  HENT:     Head: Normocephalic.     Right Ear: Hearing normal.     Left Ear: Hearing normal.  Eyes:     General: Lids are normal.        Right eye: No discharge.        Left eye: No discharge.     Conjunctiva/sclera: Conjunctivae normal.  Pulmonary:     Effort: Pulmonary effort is normal. No accessory muscle usage or respiratory distress.  Musculoskeletal:     Cervical back: Normal range of motion.  Neurological:     Mental Status: She is alert and oriented to person, place, and time.  Psychiatric:        Attention and Perception: Attention normal.        Mood and Affect: Mood normal.        Behavior: Behavior normal. Behavior is cooperative.        Thought Content: Thought content normal.        Judgment: Judgment normal.    Results for orders placed or performed in visit on 09/20/23  [id number] 11+Oxyco+Alc+Crt-Bund   Collection Time: 09/20/23 10:02 AM  Result Value Ref Range   Ethanol Negative Cutoff=0.020 %   Amphetamines, Urine Negative Cutoff=1000 ng/mL   Barbiturate Negative Cutoff=200 ng/mL   BENZODIAZ UR QL See Final Results Cutoff=200 ng/mL   Cannabinoid Quant, Ur Negative Cutoff=50 ng/mL   Cocaine (Metabolite) Negative Cutoff=300 ng/mL   OPIATE SCREEN URINE Negative Cutoff=300 ng/mL   Oxycodone /Oxymorphone, Urine Negative Cutoff=300 ng/mL   Phencyclidine Negative Cutoff=25 ng/mL   Methadone Screen, Urine Negative Cutoff=300 ng/mL   Propoxyphene Negative Cutoff=300 ng/mL   Meperidine Negative Cutoff=200 ng/mL   Tramadol Negative Cutoff=200 ng/mL   Creatinine 102.1 20.0 - 300.0 mg/dL   pH, Urine 5.7 4.5 - 8.9  Benzodiazepines Confirm, Urine    Collection Time: 09/20/23 10:02 AM  Result Value Ref Range   Benzodiazepines Negative Cutoff=200 ng/mL      Assessment & Plan:   Problem List Items Addressed This Visit       Other   Long term prescription benzodiazepine use   Refer to anxiety plan of  care.  UDS due next 09/19/24.      Insomnia   Chronic, ongoing for several years.  Continue current medication regimen to include Ambien  and Trazodone , discussed risks of long term Ambien  use and that with age will need to reduce dose. Continue good sleep hygiene.        Generalized anxiety disorder - Primary   Chronic, stable, since loss of grandson several years ago. Denies SI/HI. Has not tolerated SSRI medications.  Continues on Xanax  BID and will attempt reductions when able, she tried in the past but could not reduce. Discussed the risks of long term benzo use.  UDS due next 09/19/24.  Refills sent = 30 tablet with 2 refills.  Return in 3 months.      Relevant Medications   ALPRAZolam  (XANAX ) 0.25 MG tablet       Follow up plan: Return in about 3 months (around 06/27/2024) for Annual Physical -- after 06/20/24.

## 2024-03-28 NOTE — Assessment & Plan Note (Signed)
 Chronic, stable, since loss of grandson several years ago. Denies SI/HI. Has not tolerated SSRI medications.  Continues on Xanax  BID and will attempt reductions when able, she tried in the past but could not reduce. Discussed the risks of long term benzo use.  UDS due next 09/19/24.  Refills sent = 30 tablet with 2 refills.  Return in 3 months.

## 2024-07-02 ENCOUNTER — Other Ambulatory Visit: Payer: Self-pay | Admitting: Nurse Practitioner

## 2024-07-02 DIAGNOSIS — Z1231 Encounter for screening mammogram for malignant neoplasm of breast: Secondary | ICD-10-CM

## 2024-07-02 NOTE — Patient Instructions (Incomplete)
 Be Involved in Caring For Your Health:  Taking Medications When medications are taken as directed, they can greatly improve your health. But if they are not taken as prescribed, they may not work. In some cases, not taking them correctly can be harmful. To help ensure your treatment remains effective and safe, understand your medications and how to take them. Bring your medications to each visit for review by your provider.  Your lab results, notes, and after visit summary will be available on My Chart. We strongly encourage you to use this feature. If lab results are abnormal the clinic will contact you with the appropriate steps. If the clinic does not contact you assume the results are satisfactory. You can always view your results on My Chart. If you have questions regarding your health or results, please contact the clinic during office hours. You can also ask questions on My Chart.  We at [facility] are grateful that you chose [place] to provide your care. We strive to provide evidence-based and compassionate care and are always looking for feedback. If you get a survey from the clinic please complete this so we can hear your opinions.  Healthy Eating, Adult Healthy eating may help you get and keep a healthy body weight, reduce the risk of chronic disease, and live a long and productive life. It is important to follow a healthy eating pattern. Your nutritional and calorie needs should be met mainly by different nutrient-rich foods. What are tips for following this plan? Reading food labels Read labels and choose the following: Reduced or low sodium products. Juices with 100% fruit juice. Foods with low saturated fats (<3 g per serving) and high polyunsaturated and monounsaturated fats. Foods with whole grains, such as whole wheat, cracked wheat, brown rice, and wild rice. Whole grains that are fortified with folic acid. This is recommended for females who are pregnant or who want  to become pregnant. Read labels and do not eat or drink the following: Foods or drinks with added sugars. These include foods that contain brown sugar, corn sweetener, corn syrup, dextrose, fructose, glucose, high-fructose corn syrup, honey, invert sugar, lactose, malt syrup, maltose, molasses, raw sugar, sucrose, trehalose, or turbinado sugar. Limit your intake of added sugars to less than 10% of your total daily calories. Do not eat more than the following amounts of added sugar per day: 6 teaspoons (25 g) for females. 9 teaspoons (38 g) for males. Foods that contain processed or refined starches and grains. Refined grain products, such as white flour, degermed cornmeal, white bread, and white rice. Shopping Choose nutrient-rich snacks, such as vegetables, whole fruits, and nuts. Avoid high-calorie and high-sugar snacks, such as potato chips, fruit snacks, and candy. Use oil-based dressings and spreads on foods instead of solid fats such as butter, margarine, sour cream, or cream cheese. Limit pre-made sauces, mixes, and "instant" products such as flavored rice, instant noodles, and ready-made pasta. Try more plant-protein sources, such as tofu, tempeh, black beans, edamame, lentils, nuts, and seeds. Explore eating plans such as the Mediterranean diet or vegetarian diet. Try heart-healthy dips made with beans and healthy fats like hummus and guacamole. Vegetables go great with these. Cooking Use oil to saut or stir-fry foods instead of solid fats such as butter, margarine, or lard. Try baking, boiling, grilling, or broiling instead of frying. Remove the fatty part of meats before cooking. Steam vegetables in water or broth. Meal planning  At meals, imagine dividing your plate into fourths: One-half of  your plate is fruits and vegetables. One-fourth of your plate is whole grains. One-fourth of your plate is protein, especially lean meats, poultry, eggs, tofu, beans, or nuts. Include  low-fat dairy as part of your daily diet. Lifestyle Choose healthy options in all settings, including home, work, school, restaurants, or stores. Prepare your food safely: Wash your hands after handling raw meats. Where you prepare food, keep surfaces clean by regularly washing with hot, soapy water. Keep raw meats separate from ready-to-eat foods, such as fruits and vegetables. Cook seafood, meat, poultry, and eggs to the recommended temperature. Get a food thermometer. Store foods at safe temperatures. In general: Keep cold foods at 76F (4.4C) or below. Keep hot foods at 176F (60C) or above. Keep your freezer at [facility] (-17.8C) or below. Foods are not safe to eat if they have been between the temperatures of 40-176F (4.4-60C) for more than 2 hours. What foods should I eat? Fruits Aim to eat 1-2 cups of fresh, canned (in natural juice), or frozen fruits each day. One cup of fruit equals 1 small apple, 1 large banana, 8 large strawberries, 1 cup (237 g) canned fruit,  cup (82 g) dried fruit, or 1 cup (240 mL) 100% juice. Vegetables Aim to eat 2-4 cups of fresh and frozen vegetables each day, including different varieties and colors. One cup of vegetables equals 1 cup (91 g) broccoli or cauliflower florets, 2 medium carrots, 2 cups (150 g) raw, leafy greens, 1 large tomato, 1 large bell pepper, 1 large sweet potato, or 1 medium white potato. Grains Aim to eat 5-10 ounce-equivalents of whole grains each day. Examples of 1 ounce-equivalent of grains include 1 slice of bread, 1 cup (40 g) ready-to-eat cereal, 3 cups (24 g) popcorn, or  cup (93 g) cooked rice. Meats and other proteins Try to eat 5-7 ounce-equivalents of protein each day. Examples of 1 ounce-equivalent of protein include 1 egg,  oz nuts (12 almonds, 24 pistachios, or 7 walnut halves), 1/4 cup (90 g) cooked beans, 6 tablespoons (90 g) hummus or 1 tablespoon (16 g) peanut butter. A cut of meat or fish that is the size of a deck  of cards is about 3-4 ounce-equivalents (85 g). Of the protein you eat each week, try to have at least 8 sounce (227 g) of seafood. This is about 2 servings per week. This includes salmon, trout, herring, sardines, and anchovies. Dairy Aim to eat 3 cup-equivalents of fat-free or low-fat dairy each day. Examples of 1 cup-equivalent of dairy include 1 cup (240 mL) milk, 8 ounces (250 g) yogurt, 1 ounces (44 g) natural cheese, or 1 cup (240 mL) fortified soy milk. Fats and oils Aim for about 5 teaspoons (21 g) of fats and oils per day. Choose monounsaturated fats, such as canola and olive oils, mayonnaise made with olive oil or avocado oil, avocados, peanut butter, and most nuts, or polyunsaturated fats, such as sunflower, corn, and soybean oils, walnuts, pine nuts, sesame seeds, sunflower seeds, and flaxseed. Beverages Aim for 6 eight-ounce glasses of water per day. Limit coffee to 3-5 eight-ounce cups per day. Limit caffeinated beverages that have added calories, such as soda and energy drinks. If you drink alcohol: Limit how much you have to: 0-1 drink a day if you are female. 0-2 drinks a day if you are female. Know how much alcohol is in your drink. In the [place] one drink is one 12 oz bottle of beer (355 mL), one 5 oz glass of wine (  148 mL), or one 1 oz glass of hard liquor (44 mL). Seasoning and other foods Try not to add too much salt to your food. Try using herbs and spices instead of salt. Try not to add sugar to food. This information is based on [place] nutrition guidelines. To learn more, visit DisposableNylon.be. Exact amounts may vary. You may need different amounts. This information is not intended to replace advice given to you by your health care provider. Make sure you discuss any questions you have with your health care provider. Document Revised: 08/23/2022 Document Reviewed: 08/23/2022 Elsevier Patient Education  2024 [employer].

## 2024-07-04 ENCOUNTER — Other Ambulatory Visit: Payer: Self-pay | Admitting: Nurse Practitioner

## 2024-07-05 NOTE — Telephone Encounter (Signed)
 Requested medications are due for refill today.  yes  Requested medications are on the active medications list.  yes  Last refill. 03/28/2024 #60 2 rf  Future visit scheduled.   yes  Notes to clinic.  Refill not delegated.    Requested Prescriptions  Pending Prescriptions Disp Refills   ALPRAZolam  (XANAX ) 0.25 MG tablet [Pharmacy Med Name: ALPRAZolam  0.25 MG TABLET] 60 tablet     Sig: TAKE 1 TABLET BY MOUTH 2 TIMES A DAY     Not Delegated - Psychiatry: Anxiolytics/Hypnotics 2 Failed - 07/05/2024 11:21 AM      Failed - This refill cannot be delegated      Passed - Urine Drug Screen completed in last 360 days      Passed - Patient is not pregnant      Passed - Valid encounter within last 6 months    Recent Outpatient Visits           3 months ago Generalized anxiety disorder   [place] [facility] [place], Lori DASEN, NP

## 2024-07-05 NOTE — Telephone Encounter (Signed)
 Appointment tomorrow. Will wait for PCP return

## 2024-07-06 ENCOUNTER — Encounter: Admitting: Nurse Practitioner

## 2024-07-06 DIAGNOSIS — F5104 Psychophysiologic insomnia: Secondary | ICD-10-CM

## 2024-07-06 DIAGNOSIS — I10 Essential (primary) hypertension: Secondary | ICD-10-CM

## 2024-07-06 DIAGNOSIS — Z0000 Encounter for general adult medical examination without abnormal findings: Secondary | ICD-10-CM

## 2024-07-06 DIAGNOSIS — Z853 Personal history of malignant neoplasm of breast: Secondary | ICD-10-CM

## 2024-07-06 DIAGNOSIS — E782 Mixed hyperlipidemia: Secondary | ICD-10-CM

## 2024-07-06 DIAGNOSIS — F411 Generalized anxiety disorder: Secondary | ICD-10-CM

## 2024-07-31 ENCOUNTER — Encounter: Admitting: Nurse Practitioner

## 2024-08-07 ENCOUNTER — Ambulatory Visit
Admission: RE | Admit: 2024-08-07 | Discharge: 2024-08-07 | Disposition: A | Discharge status: Home / Self Care | Source: Ambulatory Visit | Attending: Nurse Practitioner | Admitting: Nurse Practitioner

## 2024-08-07 DIAGNOSIS — Z1231 Encounter for screening mammogram for malignant neoplasm of breast: Secondary | ICD-10-CM | POA: Diagnosis present

## 2024-08-10 ENCOUNTER — Ambulatory Visit: Payer: Self-pay | Admitting: Nurse Practitioner

## 2024-08-10 NOTE — Progress Notes (Signed)
 Contacted via MyChart   Normal mammogram, may repeat in one year:)

## 2024-08-20 ENCOUNTER — Encounter: Admitting: Nurse Practitioner

## 2024-08-22 ENCOUNTER — Other Ambulatory Visit: Payer: Self-pay | Admitting: Nurse Practitioner

## 2024-08-23 NOTE — Telephone Encounter (Signed)
 Requested medications are due for refill today.  A little too soon  Requested medications are on the active medications list.  yes  Last refill. 05/29/2024 #90 0 rf  Future visit scheduled.   yes  Notes to clinic.  Refill not delegated.    Requested Prescriptions  Pending Prescriptions Disp Refills   zolpidem  (AMBIEN ) 10 MG tablet [Pharmacy Med Name: ZOLPIDEM  TARTRATE 10 MG TABLET] 90 tablet     Sig: TAKE 1 TABLET BY MOUTH AT BEDTIME     Not Delegated - Psychiatry:  Anxiolytics/Hypnotics Failed - 08/23/2024  2:02 PM      Failed - This refill cannot be delegated      Passed - Urine Drug Screen completed in last 360 days      Passed - Valid encounter within last 6 months    Recent Outpatient Visits           4 months ago Generalized anxiety disorder   [place] [facility] [place], Melanie DASEN, NP

## 2024-09-09 ENCOUNTER — Other Ambulatory Visit: Payer: Self-pay | Admitting: Nurse Practitioner

## 2024-09-11 NOTE — Telephone Encounter (Signed)
 Requested Prescriptions  Pending Prescriptions Disp Refills   rosuvastatin  (CRESTOR ) 10 MG tablet [Pharmacy Med Name: ROSUVASTATIN  CALCIUM  10 MG TAB] 90 tablet 0    Sig: TAKE 1 TABLET BY MOUTH DAILY     Cardiovascular:  Antilipid - Statins 2 Failed - 09/11/2024  9:44 AM      Failed - Lipid Panel in normal range within the last 12 months    Cholesterol, Total  Date Value Ref Range Status  06/21/2023 163 100 - 199 mg/dL Final   LDL Chol Calc (NIH)  Date Value Ref Range Status  06/21/2023 77 0 - 99 mg/dL Final   HDL  Date Value Ref Range Status  06/21/2023 65 >39 mg/dL Final   Triglycerides  Date Value Ref Range Status  06/21/2023 117 0 - 149 mg/dL Final         Passed - Cr in normal range and within 360 days    Creatinine  Date Value Ref Range Status  09/20/2023 102.1 20.0 - 300.0 mg/dL Final   Creatinine, Ser  Date Value Ref Range Status  06/21/2023 0.69 0.57 - 1.00 mg/dL Final         Passed - Patient is not pregnant      Passed - Valid encounter within last 12 months    Recent Outpatient Visits           5 months ago Generalized anxiety disorder   [facility] [place], Jolene T, NP               traZODone  (DESYREL ) 100 MG tablet [Pharmacy Med Name: traZODone  100 MG TABLET] 90 tablet 0    Sig: TAKE 1 TABLET BY MOUTH AT BEDTIME     Psychiatry: Antidepressants - Serotonin Modulator Passed - 09/11/2024  9:44 AM      Passed - Valid encounter within last 6 months    Recent Outpatient Visits           5 months ago Generalized anxiety disorder   [place] [place], Jolene T, NP               lisinopril  (ZESTRIL ) 10 MG tablet [Pharmacy Med Name: LISINOPRIL  10 MG TABLET] 90 tablet 0    Sig: TAKE 1 TABLET BY MOUTH EVERY MORNING     Cardiovascular:  ACE Inhibitors Failed - 09/11/2024  9:44 AM      Failed - Cr in normal range and within 180 days    Creatinine  Date Value Ref Range Status  09/20/2023 102.1  20.0 - 300.0 mg/dL Final   Creatinine, Ser  Date Value Ref Range Status  06/21/2023 0.69 0.57 - 1.00 mg/dL Final         Failed - K in normal range and within 180 days    Potassium  Date Value Ref Range Status  06/21/2023 4.4 3.5 - 5.2 mmol/L Final  10/24/2013 3.4 (L) 3.5 - 5.1 mmol/L Final         Passed - Patient is not pregnant      Passed - Last BP in normal range    BP Readings from Last 1 Encounters:  03/28/24 137/84         Passed - Valid encounter within last 6 months    Recent Outpatient Visits           5 months ago Generalized anxiety disorder   [place] [facility] [place], Melanie DASEN, NP

## 2024-09-15 NOTE — Patient Instructions (Signed)
 Be Involved in Caring For Your Health:  Taking Medications When medications are taken as directed, they can greatly improve your health. But if they are not taken as prescribed, they may not work. In some cases, not taking them correctly can be harmful. To help ensure your treatment remains effective and safe, understand your medications and how to take them. Bring your medications to each visit for review by your provider.  Your lab results, notes, and after visit summary will be available on My Chart. We strongly encourage you to use this feature. If lab results are abnormal the clinic will contact you with the appropriate steps. If the clinic does not contact you assume the results are satisfactory. You can always view your results on My Chart. If you have questions regarding your health or results, please contact the clinic during office hours. You can also ask questions on My Chart.  We at [facility] are grateful that you chose [place] to provide your care. We strive to provide evidence-based and compassionate care and are always looking for feedback. If you get a survey from the clinic please complete this so we can hear your opinions.  Managing Anxiety, Adult After being diagnosed with anxiety, you may be relieved to know why you have felt or behaved a certain way. You may also feel overwhelmed about the treatment ahead and what it will mean for your life. With care and support, you can manage your anxiety. How to manage lifestyle changes Understanding the difference between stress and anxiety Although stress can play a role in anxiety, it is not the same as anxiety. Stress is your body's reaction to life changes and events, both good and bad. Stress is often caused by something external, such as a deadline, test, or competition. It normally goes away after the event has ended and will last just a few hours. But, stress can be ongoing and can lead to more than just stress. Anxiety is  caused by something internal, such as imagining a terrible outcome or worrying that something will go wrong that will greatly upset you. Anxiety often does not go away even after the event is over, and it can become a long-term (chronic) worry. Lowering stress and anxiety Talk with your health care provider or a counselor to learn more about lowering anxiety and stress. They may suggest tension-reduction techniques, such as: Music. Spend time creating or listening to music that you enjoy and that inspires you. Mindfulness-based meditation. Practice being aware of your normal breaths while not trying to control your breathing. It can be done while sitting or walking. Centering prayer. Focus on a word, phrase, or sacred image that means something to you and brings you peace. Deep breathing. Expand your stomach and inhale slowly through your nose. Hold your breath for 3-5 seconds. Then breathe out slowly, letting your stomach muscles relax. Self-talk. Learn to notice and spot thought patterns that lead to anxiety reactions. Change those patterns to thoughts that feel peaceful. Muscle relaxation. Take time to tense muscles and then relax them. Choose a tension-reduction technique that fits your lifestyle and personality. These techniques take time and practice. Set aside 5-15 minutes a day to do them. Specialized therapists can offer counseling and training in these techniques. The training to help with anxiety may be covered by some insurance plans. Other things you can do to manage stress and anxiety include: Keeping a stress diary. This can help you learn what triggers your reaction and then learn ways  to manage your response. Thinking about how you react to certain situations. You may not be able to control everything, but you can control your response. Making time for activities that help you relax and not feeling guilty about spending your time in this way. Doing visual imagery. This involves  imagining or creating mental pictures to help you relax. Practicing yoga. Through yoga poses, you can lower tension and relax.  Medicines Medicines for anxiety include: Antidepressant medicines. These are usually prescribed for long-term daily control. Anti-anxiety medicines. These may be added in severe cases, especially when panic attacks occur. When used together, medicines, psychotherapy, and tension-reduction techniques may be the most effective treatment. Relationships Relationships can play a big part in helping you recover. Spend more time connecting with trusted friends and family members. Think about going to couples counseling if you have a partner, taking family education classes, or going to family therapy. Therapy can help you and others better understand your anxiety. How to recognize changes in your anxiety Everyone responds differently to treatment for anxiety. Recovery from anxiety happens when symptoms lessen and stop interfering with your daily life at home or work. This may mean that you will start to: Have better concentration and focus. Worry will interfere less in your daily thinking. Sleep better. Be less irritable. Have more energy. Have improved memory. Try to recognize when your condition is getting worse. Contact your provider if your symptoms interfere with home or work and you feel like your condition is not improving. Follow these instructions at home: Activity Exercise. Adults should: Exercise for at least 150 minutes each week. The exercise should increase your heart rate and make you sweat (moderate-intensity exercise). Do strengthening exercises at least twice a week. Get the right amount and quality of sleep. Most adults need 7-9 hours of sleep each night. Lifestyle  Eat a healthy diet that includes plenty of vegetables, fruits, whole grains, low-fat dairy products, and lean protein. Do not eat a lot of foods that are high in fats, added sugars, or salt  (sodium). Make choices that simplify your life. Do not use any products that contain nicotine or tobacco. These products include cigarettes, chewing tobacco, and vaping devices, such as e-cigarettes. If you need help quitting, ask your provider. Avoid caffeine, alcohol, and certain over-the-counter cold medicines. These may make you feel worse. Ask your pharmacist which medicines to avoid. General instructions Take over-the-counter and prescription medicines only as told by your provider. Keep all follow-up visits. This is to make sure you are managing your anxiety well or if you need more support. Where to find support You can get help and support from: Self-help groups. Online and [employer]. A trusted spiritual leader. Couples counseling. Family education classes. Family therapy. Where to find more information You may find that joining a support group helps you deal with your anxiety. The following sources can help you find counselors or support groups near you: Mental Health America: mentalhealthamerica.net Anxiety and Depression Association of [place] (ADAA): adaa.org The [employer] on Mental Illness (NAMI): nami.org Contact a health care provider if: You have a hard time staying focused or finishing tasks. You spend many hours a day feeling worried about everyday life. You are very tired because you cannot stop worrying. You start to have headaches or often feel tense. You have chronic nausea or diarrhea. Get help right away if: Your heart feels like it is racing. You have shortness of breath. You have thoughts of hurting yourself or others. Get help  right away if you feel like you may hurt yourself or others, or have thoughts about taking your own life. Go to your nearest emergency room or: Call 911. Call the National Suicide Prevention Lifeline at 765-482-1593 or 988. This is open 24 hours a day. Text the Crisis Text Line at 504 124 9896. This information is not  intended to replace advice given to you by your health care provider. Make sure you discuss any questions you have with your health care provider. Document Revised: 08/31/2022 Document Reviewed: 03/15/2021 Elsevier Patient Education  2024 [employer].

## 2024-09-18 ENCOUNTER — Encounter: Payer: Self-pay | Admitting: Nurse Practitioner

## 2024-09-18 ENCOUNTER — Ambulatory Visit (INDEPENDENT_AMBULATORY_CARE_PROVIDER_SITE_OTHER): Admitting: Nurse Practitioner

## 2024-09-18 VITALS — BP 130/74 | HR 63 | Temp 98.4°F | Resp 17 | Ht 67.21 in | Wt 138.6 lb

## 2024-09-18 DIAGNOSIS — I10 Essential (primary) hypertension: Secondary | ICD-10-CM | POA: Diagnosis not present

## 2024-09-18 DIAGNOSIS — Z79899 Other long term (current) drug therapy: Secondary | ICD-10-CM

## 2024-09-18 DIAGNOSIS — F5104 Psychophysiologic insomnia: Secondary | ICD-10-CM

## 2024-09-18 DIAGNOSIS — K582 Mixed irritable bowel syndrome: Secondary | ICD-10-CM | POA: Diagnosis not present

## 2024-09-18 DIAGNOSIS — Z0000 Encounter for general adult medical examination without abnormal findings: Secondary | ICD-10-CM | POA: Diagnosis not present

## 2024-09-18 DIAGNOSIS — E782 Mixed hyperlipidemia: Secondary | ICD-10-CM | POA: Diagnosis not present

## 2024-09-18 DIAGNOSIS — F411 Generalized anxiety disorder: Secondary | ICD-10-CM

## 2024-09-18 MED ORDER — ALPRAZOLAM 0.25 MG PO TABS: 0.2500 mg | ORAL_TABLET | Freq: Two times a day (BID) | ORAL | 3 refills | Status: AC

## 2024-09-18 MED ORDER — ROSUVASTATIN CALCIUM 10 MG PO TABS: 10.0000 mg | ORAL_TABLET | Freq: Every day | ORAL | 4 refills | Status: AC

## 2024-09-18 MED ORDER — LISINOPRIL 10 MG PO TABS: 10.0000 mg | ORAL_TABLET | Freq: Every morning | ORAL | 4 refills | Status: AC

## 2024-09-18 MED ORDER — ZOLPIDEM TARTRATE 10 MG PO TABS: 10.0000 mg | ORAL_TABLET | Freq: Every day | ORAL | 0 refills | Status: AC

## 2024-09-18 MED ORDER — TRAZODONE HCL 100 MG PO TABS: 100.0000 mg | ORAL_TABLET | Freq: Every day | ORAL | 4 refills | Status: AC

## 2024-09-18 NOTE — Assessment & Plan Note (Signed)
 Refer to anxiety plan of care.  UDS due next 09/18/25.

## 2024-09-18 NOTE — Progress Notes (Signed)
 BP 130/74 (BP Location: Left Arm, Patient Position: Sitting, Cuff Size: Normal)   Pulse 63   Temp 98.4 F (36.9 C) (Oral)   Resp 17   Ht 5' 7.21 (1.707 m)   Wt 138 lb 9.6 oz (62.9 kg)   SpO2 98%   BMI 21.58 kg/m    Subjective:    Patient ID: Lori May, female    DOB: 07-30-1961, 63 y.o.   MRN: [id number]  HPI: Lori May is a 63 y.o. female presenting on 09/18/2024 for comprehensive medical examination. Current medical complaints include: none  She currently lives with: husband Menopausal Symptoms: no   HYPERTENSION Taking Lisinopril  daily and Rosuvastatin  daily. Hypertension status: controlled  Satisfied with current treatment? yes Duration of hypertension: chronic BP monitoring frequency: rarely BP range:  BP medication side effects:  no Medication compliance: excellent compliance Aspirin: no Recurrent headaches: no Visual changes: no Palpitations: no Dyspnea: no Chest pain: no Lower extremity edema: no Dizzy/lightheaded: no  The 10-year ASCVD risk score (Arnett DK, et al., 2019) is: 5.7%   Values used to calculate the score:     Age: 70 years     Clincally relevant sex: Female     Is Non-Hispanic African American: No     Diabetic: No     Tobacco smoker: No     Systolic Blood Pressure: 130 mmHg     Is BP treated: Yes     HDL Cholesterol: 65 mg/dL     Total Cholesterol: 163 mg/dL  ANXIETY/STRESS Lost a grandson >3 years ago at young age to brain tumor, she was placed on Xanax  and Ambien  at that time.  Tried different medications -- Zoloft  and some others in past. Pt made aware of risks of benzo medication use to include increased sedation, respiratory suppression, falls, dependence and cardiovascular events.  Pt would like to continue treatment as benefit determined to outweigh risk. Has underlying IBS with anxiety.  Taken Ambien  and Trazodone  for insomnia, taken for > 10 years.  Discussed at length risks of long term Ambien  use and that when age 30 may  need to lower dose to 5 MG. Last Ambien  fill on PDMP review was 08/28/24 (#90 tablets) and Xanax  08/22/24 (#60 tablets).   Mood status: stable Satisfied with current treatment?: yes Symptom severity: mild  Duration of current treatment : chronic Side effects: no Medication compliance: fair compliance Psychotherapy/counseling: yes, currently Previous psychiatric medications: multiple medications Depressed mood: no Anxious mood: no Anhedonia: no Significant weight loss or gain: no Insomnia: no  Fatigue: no Feelings of worthlessness or guilt: no Impaired concentration/indecisiveness: no Suicidal ideations: no Hopelessness: no Crying spells: yes    09/18/2024   11:00 AM 03/28/2024   10:17 AM 12/21/2023   10:19 AM 09/20/2023    9:43 AM 06/21/2023   10:43 AM  Depression screen PHQ 2/9  Decreased Interest 0 0 0 0 0  Down, Depressed, Hopeless 0 0 1 0 0  PHQ - 2 Score 0 0 1 0 0  Altered sleeping 0 0 0 0 0  Tired, decreased energy 0 0 0 0 0  Change in appetite 0 0 0 0 0  Feeling bad or failure about yourself  0 0 0 0 0  Trouble concentrating 0 0 0 0 0  Moving slowly or fidgety/restless 0 0 0 0 0  Suicidal thoughts 0 0 0 0 0  PHQ-9 Score 0 0 1 0 0  Difficult doing work/chores  Not difficult at all Not difficult  at all Not difficult at all Not difficult at all       09/18/2024   11:00 AM 03/28/2024   10:18 AM 12/21/2023   10:21 AM 09/20/2023    9:43 AM  GAD 7 : Generalized Anxiety Score  Nervous, Anxious, on Edge 0 1 1 0  Control/stop worrying 0 1 1 0  Worry too much - different things 0 1 1 0  Trouble relaxing 0 0 0 0  Restless 0 0 0 0  Easily annoyed or irritable 0 0 0 0  Afraid - awful might happen 0 1 0 0  Total GAD 7 Score 0 4 3 0  Anxiety Difficulty  Not difficult at all Not difficult at all Not difficult at all       06/21/2023   10:43 AM 09/20/2023    9:42 AM 12/21/2023   10:19 AM 03/28/2024   10:17 AM 09/18/2024   11:00 AM  Fall Risk  Falls in the past year? 0 0  0 0 0  Was there an injury with Fall? 0 0 0 0 0  Fall Risk Category Calculator 0 0 0 0 0  Patient at Risk for Falls Due to No Fall Risks No Fall Risks No Fall Risks No Fall Risks No Fall Risks  Fall risk Follow up Falls evaluation completed Falls evaluation completed Falls evaluation completed Falls evaluation completed Falls evaluation completed    Functional Status Survey: Is the patient deaf or have difficulty hearing?: No Does the patient have difficulty seeing, even when wearing glasses/contacts?: No Does the patient have difficulty concentrating, remembering, or making decisions?: No Does the patient have difficulty walking or climbing stairs?: No Does the patient have difficulty dressing or bathing?: No Does the patient have difficulty doing errands alone such as visiting a doctor's office or shopping?: No   Past Medical History:  Past Medical History:  Diagnosis Date   Anemia    H/O   Anxiety    Breast neoplasm, 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   Cancer (HCC) 2014   right breast LCIS   GERD (gastroesophageal reflux disease)    Hypertension    Postmenopausal 2013   Sleep apnea     Surgical History:  Past Surgical History:  Procedure Laterality Date   BREAST BIOPSY Left 10/06/2015   ADH   BREAST EXCISIONAL BIOPSY Left 01/2016   lumpectomy to remove ADH   BREAST EXCISIONAL BIOPSY Right 10/26/2013   lumpectomy breast lobular carcinoma    BREAST LUMPECTOMY Right 2014   LCIS   BREAST LUMPECTOMY WITH NEEDLE LOCALIZATION Left 01/07/2016   Procedure: BREAST LUMPECTOMY WITH NEEDLE LOCALIZATION;  Surgeon: Dorothyann LITTIE Husk, MD;  Location: [facility] ORS;  Service: General;  Laterality: Left;   BREAST SURGERY Right 10/26/2013   Partial Mastectomy   NOVASURE ABLATION  2007    Medications:  Current Outpatient Medications on File Prior to Visit  Medication Sig   acetaminophen  (TYLENOL ) 500 MG tablet Take 500 mg by mouth as needed.   dicyclomine  (BENTYL ) 10 MG capsule Take 1  capsule (10 mg total) by mouth 3 (three) times daily before meals.   No current facility-administered medications on file prior to visit.    Allergies:  No Known Allergies  Social History:  Social History   Socioeconomic History   Marital status: Married    Spouse name: Not on file   Number of children: Not on file   Years of education: Not on file   Highest education level: 12th  grade  Occupational History   Not on file  Tobacco Use   Smoking status: Former    Current packs/day: 0.00    Types: Cigarettes    Start date: 06/09/2016    Quit date: 06/09/2016    Years since quitting: 8.2   Smokeless tobacco: Never   Tobacco comments:    ocassional smoker -- social smoker  Vaping Use   Vaping status: Never Used  Substance and Sexual Activity   Alcohol use: Yes    Alcohol/week: 6.0 standard drinks of alcohol    Types: 3 Glasses of wine, 3 Cans of beer per week    Comment: wine OCC   Drug use: No   Sexual activity: Yes    Birth control/protection: None  Other Topics Concern   Not on file  Social History Narrative   Not on file   Social Drivers of Health   Financial Resource Strain: Low Risk  (09/17/2024)   Overall Financial Resource Strain (CARDIA)    Difficulty of Paying Living Expenses: Not hard at all  Food Insecurity: No Food Insecurity (09/17/2024)   Hunger Vital Sign    Worried About Running Out of Food in the Last Year: Never true    Ran Out of Food in the Last Year: Never true  Transportation Needs: No Transportation Needs (09/17/2024)   PRAPARE - Administrator, Civil Service (Medical): No    Lack of Transportation (Non-Medical): No  Physical Activity: Sufficiently Active (09/17/2024)   Exercise Vital Sign    Days of Exercise per Week: 4 days    Minutes of Exercise per Session: 60 min  Stress: No Stress Concern Present (09/17/2024)   [employer] of Occupational Health - Occupational Stress Questionnaire    Feeling of Stress: Only a little   Social Connections: Moderately Isolated (09/17/2024)   Social Connection and Isolation Panel    Frequency of Communication with Friends and Family: Once a week    Frequency of Social Gatherings with Friends and Family: Twice a week    Attends Religious Services: Patient declined    Database administrator or Organizations: No    Attends Engineer, structural: Not on file    Marital Status: Married  Catering manager Violence: Not At Risk (09/18/2024)   Humiliation, Afraid, Rape, and Kick questionnaire    Fear of Current or Ex-Partner: No    Emotionally Abused: No    Physically Abused: No    Sexually Abused: No   Social History   Tobacco Use  Smoking Status Former   Current packs/day: 0.00   Types: Cigarettes   Start date: 06/09/2016   Quit date: 06/09/2016   Years since quitting: 8.2  Smokeless Tobacco Never  Tobacco Comments   ocassional smoker -- social smoker   Social History   Substance and Sexual Activity  Alcohol Use Yes   Alcohol/week: 6.0 standard drinks of alcohol   Types: 3 Glasses of wine, 3 Cans of beer per week   Comment: wine OCC    Family History:  Family History  Problem Relation Age of Onset   Leukemia Mother 53   Heart disease Father    Hypertension Sister    Hypertension Brother    Hypertension Brother    Breast cancer Neg Hx     Past medical history, surgical history, medications, allergies, family history and social history reviewed with patient today and changes made to appropriate areas of the chart.   Review of Systems - negative All other  ROS negative except what is listed above and in the HPI.      Objective:    BP 130/74 (BP Location: Left Arm, Patient Position: Sitting, Cuff Size: Normal)   Pulse 63   Temp 98.4 F (36.9 C) (Oral)   Resp 17   Ht 5' 7.21 (1.707 m)   Wt 138 lb 9.6 oz (62.9 kg)   SpO2 98%   BMI 21.58 kg/m   Wt Readings from Last 3 Encounters:  09/18/24 138 lb 9.6 oz (62.9 kg)  03/28/24 142 lb 9.6 oz  (64.7 kg)  12/21/23 144 lb 6.4 oz (65.5 kg)    Physical Exam Vitals and nursing note reviewed. Exam conducted with a chaperone present.  Constitutional:      General: She is awake. She is not in acute distress.    Appearance: She is well-developed and well-groomed. She is not ill-appearing or toxic-appearing.  HENT:     Head: Normocephalic and atraumatic.     Right Ear: Hearing, tympanic membrane, ear canal and external ear normal. No drainage.     Left Ear: Hearing, tympanic membrane, ear canal and external ear normal. No drainage.     Nose: Nose normal.     Right Sinus: No maxillary sinus tenderness or frontal sinus tenderness.     Left Sinus: No maxillary sinus tenderness or frontal sinus tenderness.     Mouth/Throat:     Mouth: Mucous membranes are moist.     Pharynx: Oropharynx is clear. Uvula midline. No pharyngeal swelling, oropharyngeal exudate or posterior oropharyngeal erythema.  Eyes:     General: Lids are normal.        Right eye: No discharge.        Left eye: No discharge.     Extraocular Movements: Extraocular movements intact.     Conjunctiva/sclera: Conjunctivae normal.     Pupils: Pupils are equal, round, and reactive to light.     Visual Fields: Right eye visual fields normal and left eye visual fields normal.  Neck:     Thyroid: No thyromegaly.     Vascular: No carotid bruit.     Trachea: Trachea normal.  Cardiovascular:     Rate and Rhythm: Normal rate and regular rhythm.     Heart sounds: Normal heart sounds. No murmur heard.    No gallop.  Pulmonary:     Effort: Pulmonary effort is normal. No accessory muscle usage or respiratory distress.     Breath sounds: Normal breath sounds.  Chest:  Breasts:    Right: Normal.     Left: Normal.  Abdominal:     General: Bowel sounds are normal.     Palpations: Abdomen is soft. There is no hepatomegaly or splenomegaly.     Tenderness: There is no abdominal tenderness.  Musculoskeletal:        General: Normal  range of motion.     Cervical back: Normal range of motion and neck supple.     Right lower leg: No edema.     Left lower leg: No edema.  Lymphadenopathy:     Head:     Right side of head: No submental, submandibular, tonsillar, preauricular or posterior auricular adenopathy.     Left side of head: No submental, submandibular, tonsillar, preauricular or posterior auricular adenopathy.     Cervical: No cervical adenopathy.     Upper Body:     Right upper body: No supraclavicular, axillary or pectoral adenopathy.     Left upper body: No supraclavicular, axillary  or pectoral adenopathy.  Skin:    General: Skin is warm and dry.     Capillary Refill: Capillary refill takes less than 2 seconds.     Findings: No rash.  Neurological:     Mental Status: She is alert and oriented to person, place, and time.     Gait: Gait is intact.     Deep Tendon Reflexes: Reflexes are normal and symmetric.     Reflex Scores:      Brachioradialis reflexes are 2+ on the right side and 2+ on the left side.      Patellar reflexes are 2+ on the right side and 2+ on the left side. Psychiatric:        Attention and Perception: Attention normal.        Mood and Affect: Mood normal.        Speech: Speech normal.        Behavior: Behavior normal. Behavior is cooperative.        Thought Content: Thought content normal.        Judgment: Judgment normal.    Results for orders placed or performed in visit on 09/20/23  [id number] 11+Oxyco+Alc+Crt-Bund   Collection Time: 09/20/23 10:02 AM  Result Value Ref Range   Ethanol Negative Cutoff=0.020 %   Amphetamines, Urine Negative Cutoff=1000 ng/mL   Barbiturate Negative Cutoff=200 ng/mL   BENZODIAZ UR QL See Final Results Cutoff=200 ng/mL   Cannabinoid Quant, Ur Negative Cutoff=50 ng/mL   Cocaine (Metabolite) Negative Cutoff=300 ng/mL   OPIATE SCREEN URINE Negative Cutoff=300 ng/mL   Oxycodone /Oxymorphone, Urine Negative Cutoff=300 ng/mL   Phencyclidine Negative  Cutoff=25 ng/mL   Methadone Screen, Urine Negative Cutoff=300 ng/mL   Propoxyphene Negative Cutoff=300 ng/mL   Meperidine Negative Cutoff=200 ng/mL   Tramadol Negative Cutoff=200 ng/mL   Creatinine 102.1 20.0 - 300.0 mg/dL   pH, Urine 5.7 4.5 - 8.9  Benzodiazepines Confirm, Urine   Collection Time: 09/20/23 10:02 AM  Result Value Ref Range   Benzodiazepines Negative Cutoff=200 ng/mL      Assessment & Plan:   Problem List Items Addressed This Visit       Cardiovascular and Mediastinum   Essential hypertension - Primary   Chronic, stable.  BP below goal on recheck today.  Will maintain Lisinopril  at this time for kidney protection, educated her on this and to alert provider if elevation in BP consistently at home >130/80.  Recommend she continue to monitor BP at home, monitoring a few days a week and documenting for provider.  DASH diet focus at home. LABS today: CMP, TSH, CBC.      Relevant Medications   rosuvastatin  (CRESTOR ) 10 MG tablet   lisinopril  (ZESTRIL ) 10 MG tablet   Other Relevant Orders   CBC with Differential/Platelet   Comprehensive metabolic panel with GFR   TSH     Digestive   Irritable bowel syndrome with both constipation and diarrhea   Chronic, with underlying anxiety.  Continue Bentyl  as needed and recommend Metamucil x 2 gummies daily to help regulate bowels.  Consider GI referral if ongoing.        Other   Long term prescription benzodiazepine use   Refer to anxiety plan of care.  UDS due next 09/18/25.      Relevant Orders   [id number] 11+Oxyco+Alc+Crt-Bund   Insomnia   Chronic, ongoing for several years. Continue current medication regimen to include Ambien  and Trazodone , discussed risks of long term Ambien  use and that with age will need to reduce dose.  Continue good sleep hygiene.        Hyperlipidemia, mixed   Chronic, ongoing.  Continue Crestor  as is tolerating well without ADR.  Lipid panel today.      Relevant Medications   rosuvastatin   (CRESTOR ) 10 MG tablet   lisinopril  (ZESTRIL ) 10 MG tablet   Other Relevant Orders   Comprehensive metabolic panel with GFR   Lipid Panel w/o Chol/HDL Ratio   Generalized anxiety disorder   Chronic, stable, since loss of grandson several years ago. Denies SI/HI. Has not tolerated SSRI medications.  Continues on Xanax  BID and will attempt reductions when able, she tried in the past but could not reduce. Discussed the risks of long term benzo use.  UDS due next 09/18/25.  Refills sent = 30 tablet with 3 refills.  Return in 3 months.      Relevant Medications   ALPRAZolam  (XANAX ) 0.25 MG tablet (Start on 09/20/2024)   traZODone  (DESYREL ) 100 MG tablet   Other Visit Diagnoses       Encounter for annual physical exam       Annual physical today with labs and health maintenance reviewed, discussed with patient. refuses vaccines and pap testing.        Follow up plan: Return in about 6 months (around 03/19/2025) for WELCOME TO MEDICARE.   LABORATORY TESTING:  - Pap smear: refuses  IMMUNIZATIONS:   - Tdap: Tetanus vaccination status reviewed: last tetanus booster within 10 years. - Influenza: Up to date - Pneumovax: Not applicable - Prevnar: Not applicable - HPV: Not applicable - Zostavax vaccine: Refused  SCREENING: -Mammogram: Up To Date -- last on 08/07/24 - Colonoscopy: Cologuard Up To Date - Bone Density: Not applicable  -Hearing Test: Not applicable  -Spirometry: Not applicable   PATIENT COUNSELING:   Advised to take 1 mg of folate supplement per day if capable of pregnancy.   Sexuality: Discussed sexually transmitted diseases, partner selection, use of condoms, avoidance of unintended pregnancy  and contraceptive alternatives.   Advised to avoid cigarette smoking.  I discussed with the patient that most people either abstain from alcohol or drink within safe limits (<=14/week and <=4 drinks/occasion for males, <=7/weeks and <= 3 drinks/occasion for females) and that the  risk for alcohol disorders and other health effects rises proportionally with the number of drinks per week and how often a drinker exceeds daily limits.  Discussed cessation/primary prevention of drug use and availability of treatment for abuse.   Diet: Encouraged to adjust caloric intake to maintain  or achieve ideal body weight, to reduce intake of dietary saturated fat and total fat, to limit sodium intake by avoiding high sodium foods and not adding table salt, and to maintain adequate dietary potassium and calcium  preferably from fresh fruits, vegetables, and low-fat dairy products.    Stressed the importance of regular exercise  Injury prevention: Discussed safety belts, safety helmets, smoke detector, smoking near bedding or upholstery.   Dental health: Discussed importance of regular tooth brushing, flossing, and dental visits.    NEXT PREVENTATIVE PHYSICAL DUE IN 1 YEAR. Return in about 6 months (around 03/19/2025) for WELCOME TO MEDICARE.

## 2024-09-18 NOTE — Assessment & Plan Note (Signed)
Chronic, ongoing.  Continue Crestor as is tolerating well without ADR.  Lipid panel today.

## 2024-09-18 NOTE — Assessment & Plan Note (Signed)
 Chronic, stable.  BP below goal on recheck today.  Will maintain Lisinopril  at this time for kidney protection, educated her on this and to alert provider if elevation in BP consistently at home >130/80.  Recommend she continue to monitor BP at home, monitoring a few days a week and documenting for provider.  DASH diet focus at home. LABS today: CMP, TSH, CBC.

## 2024-09-18 NOTE — Assessment & Plan Note (Signed)
 Chronic, with underlying anxiety.  Continue Bentyl  as needed and recommend Metamucil x 2 gummies daily to help regulate bowels.  Consider GI referral if ongoing.

## 2024-09-18 NOTE — Assessment & Plan Note (Signed)
Chronic, ongoing for several years.  Continue current medication regimen to include Ambien and Trazodone, discussed risks of long term Ambien use and that with age will need to reduce dose. Continue good sleep hygiene.  

## 2024-09-18 NOTE — Assessment & Plan Note (Signed)
 Chronic, stable, since loss of grandson several years ago. Denies SI/HI. Has not tolerated SSRI medications.  Continues on Xanax  BID and will attempt reductions when able, she tried in the past but could not reduce. Discussed the risks of long term benzo use.  UDS due next 09/18/25.  Refills sent = 30 tablet with 3 refills.  Return in 3 months.

## 2024-09-19 ENCOUNTER — Ambulatory Visit: Payer: Self-pay | Admitting: Nurse Practitioner

## 2024-09-19 LAB — DRUG SCREEN 764883 11+OXYCO+ALC+CRT-BUND

## 2024-09-19 LAB — COMPREHENSIVE METABOLIC PANEL WITH GFR
ALT: 16 IU/L (ref 0–32)
AST: 26 IU/L (ref 0–40)
Albumin: 4.7 g/dL (ref 3.9–4.9)
Alkaline Phosphatase: 68 IU/L (ref 49–135)
BUN/Creatinine Ratio: 14 (ref 12–28)
BUN: 10 mg/dL (ref 8–27)
Bilirubin Total: 0.4 mg/dL (ref 0.0–1.2)
CO2: 23 mmol/L (ref 20–29)
Calcium: 9.8 mg/dL (ref 8.7–10.3)
Chloride: 100 mmol/L (ref 96–106)
Creatinine, Ser: 0.69 mg/dL (ref 0.57–1.00)
Globulin, Total: 2 g/dL (ref 1.5–4.5)
Glucose: 92 mg/dL (ref 70–99)
Potassium: 3.9 mmol/L (ref 3.5–5.2)
Sodium: 138 mmol/L (ref 134–144)
Total Protein: 6.7 g/dL (ref 6.0–8.5)
eGFR: 97 mL/min/1.73 (ref >59)

## 2024-09-19 LAB — CBC WITH DIFFERENTIAL/PLATELET
Basophils Absolute: 0 x10E3/uL (ref 0.0–0.2)
Basos: 0 %
EOS (ABSOLUTE): 0 x10E3/uL (ref 0.0–0.4)
Eos: 1 %
Hematocrit: 42.8 % (ref 34.0–46.6)
Hemoglobin: 14.6 g/dL (ref 11.1–15.9)
Immature Grans (Abs): 0 x10E3/uL (ref 0.0–0.1)
Immature Granulocytes: 0 %
Lymphocytes Absolute: 1.5 x10E3/uL (ref 0.7–3.1)
Lymphs: 30 %
MCH: 33.7 pg — ABNORMAL HIGH (ref 26.6–33.0)
MCHC: 34.1 g/dL (ref 31.5–35.7)
MCV: 99 fL — ABNORMAL HIGH (ref 79–97)
Monocytes Absolute: 0.4 x10E3/uL (ref 0.1–0.9)
Monocytes: 8 %
Neutrophils Absolute: 3 x10E3/uL (ref 1.4–7.0)
Neutrophils: 61 %
Platelets: 221 x10E3/uL (ref 150–450)
RBC: 4.33 x10E6/uL (ref 3.77–5.28)
RDW: 11.6 % — ABNORMAL LOW (ref 11.7–15.4)
WBC: 5 x10E3/uL (ref 3.4–10.8)

## 2024-09-19 LAB — LIPID PANEL W/O CHOL/HDL RATIO
Cholesterol, Total: 179 mg/dL (ref 100–199)
HDL: 76 mg/dL (ref >39)
LDL Chol Calc (NIH): 81 mg/dL (ref 0–99)
Triglycerides: 127 mg/dL (ref 0–149)
VLDL Cholesterol Cal: 22 mg/dL (ref 5–40)

## 2024-09-19 LAB — TSH: TSH: 2.55 u[IU]/mL (ref 0.450–4.500)

## 2024-09-19 NOTE — Progress Notes (Signed)
 Contacted via MyChart  Good afternoon Lori May, your labs have returned and overall remain stable. No medication changes needed.  Great job.  Any questions? Keep being amazing!!  Thank you for allowing me to participate in your care.  I appreciate you. Kindest regards, Oluwatoni Rotunno

## 2024-09-21 LAB — DRUG SCREEN 764883 11+OXYCO+ALC+CRT-BUND
Amphetamines, Urine: NEGATIVE ng/mL
Barbiturate screen, urine: NEGATIVE ng/mL
Cannabinoid Quant, Ur: NEGATIVE ng/mL
Cocaine (Metab.): NEGATIVE ng/mL
Creatinine, Urine: 92.7 mg/dL (ref 20.0–300.0)
Ethanol, Urine: NEGATIVE %
Meperidine: NEGATIVE ng/mL
Methadone Screen, Urine: NEGATIVE ng/mL
Nitrite Urine, Quantitative: NEGATIVE ug/mL
OPIATE SCREEN URINE: NEGATIVE ng/mL
Oxycodone/Oxymorphone, Urine: NEGATIVE ng/mL
PCP Quant, Ur: NEGATIVE ng/mL
Propoxyphene: NEGATIVE ng/mL
Tramadol: NEGATIVE ng/mL
pH, Urine: 6 (ref 4.5–8.9)

## 2024-09-21 LAB — BENZODIAZEPINES CONFIRM, URINE: Benzodiazepines: NEGATIVE ng/mL

## 2025-03-19 ENCOUNTER — Encounter: Admitting: Nurse Practitioner
